# Patient Record
Sex: Female | Born: 1985 | Race: White | Hispanic: No | Marital: Single | Smoking: Never smoker
Health system: Southern US, Community
[De-identification: ages and names within clinical notes are randomized; demographics above are authoritative.]

## PROBLEM LIST (undated history)

## (undated) DIAGNOSIS — N200 Calculus of kidney: Secondary | ICD-10-CM

## (undated) HISTORY — PX: OOPHORECTOMY: SHX86

## (undated) HISTORY — DX: Calculus of kidney: N20.0

---

## 2004-01-19 ENCOUNTER — Inpatient Hospital Stay: Payer: Self-pay | Admitting: Obstetrics & Gynecology

## 2005-02-11 ENCOUNTER — Emergency Department: Payer: Self-pay | Admitting: Emergency Medicine

## 2005-08-15 ENCOUNTER — Emergency Department: Payer: Self-pay | Admitting: Emergency Medicine

## 2005-08-20 ENCOUNTER — Emergency Department: Payer: Self-pay | Admitting: Emergency Medicine

## 2005-11-01 ENCOUNTER — Emergency Department: Payer: Self-pay | Admitting: Emergency Medicine

## 2006-04-08 ENCOUNTER — Emergency Department: Payer: Self-pay

## 2006-05-24 ENCOUNTER — Emergency Department: Payer: Self-pay | Admitting: Emergency Medicine

## 2006-07-21 ENCOUNTER — Emergency Department: Payer: Self-pay | Admitting: Internal Medicine

## 2006-12-02 ENCOUNTER — Emergency Department: Payer: Self-pay | Admitting: Emergency Medicine

## 2006-12-31 ENCOUNTER — Emergency Department: Payer: Self-pay | Admitting: Emergency Medicine

## 2007-04-12 ENCOUNTER — Emergency Department: Payer: Self-pay | Admitting: Emergency Medicine

## 2007-05-15 ENCOUNTER — Emergency Department: Payer: Self-pay | Admitting: Emergency Medicine

## 2007-08-19 ENCOUNTER — Observation Stay: Payer: Self-pay

## 2007-10-10 ENCOUNTER — Inpatient Hospital Stay: Payer: Self-pay | Admitting: Obstetrics and Gynecology

## 2008-02-16 ENCOUNTER — Ambulatory Visit: Payer: Self-pay

## 2009-03-01 ENCOUNTER — Ambulatory Visit: Payer: Self-pay | Admitting: Internal Medicine

## 2009-03-02 ENCOUNTER — Emergency Department: Payer: Self-pay | Admitting: Emergency Medicine

## 2009-03-08 ENCOUNTER — Ambulatory Visit: Payer: Self-pay

## 2009-03-14 ENCOUNTER — Ambulatory Visit: Payer: Self-pay | Admitting: Gynecologic Oncology

## 2009-04-01 ENCOUNTER — Ambulatory Visit: Payer: Self-pay | Admitting: Internal Medicine

## 2011-01-21 ENCOUNTER — Emergency Department: Payer: Self-pay | Admitting: Emergency Medicine

## 2011-01-23 ENCOUNTER — Ambulatory Visit: Payer: Self-pay | Admitting: Internal Medicine

## 2011-01-28 ENCOUNTER — Ambulatory Visit: Payer: Self-pay | Admitting: Urology

## 2011-02-07 ENCOUNTER — Ambulatory Visit: Payer: Self-pay | Admitting: Urology

## 2011-10-08 ENCOUNTER — Ambulatory Visit (INDEPENDENT_AMBULATORY_CARE_PROVIDER_SITE_OTHER): Payer: PRIVATE HEALTH INSURANCE | Admitting: Family Medicine

## 2011-10-08 ENCOUNTER — Encounter: Payer: Self-pay | Admitting: Family Medicine

## 2011-10-08 VITALS — BP 100/80 | HR 72 | Temp 98.1°F | Ht 63.5 in | Wt 144.0 lb

## 2011-10-08 DIAGNOSIS — R300 Dysuria: Secondary | ICD-10-CM

## 2011-10-08 DIAGNOSIS — R5383 Other fatigue: Secondary | ICD-10-CM

## 2011-10-08 DIAGNOSIS — Z87442 Personal history of urinary calculi: Secondary | ICD-10-CM | POA: Insufficient documentation

## 2011-10-08 DIAGNOSIS — R5381 Other malaise: Secondary | ICD-10-CM

## 2011-10-08 LAB — POCT URINALYSIS DIPSTICK
Ketones, UA: NEGATIVE
pH, UA: 6.5

## 2011-10-08 LAB — CBC WITH DIFFERENTIAL/PLATELET
Eosinophils Relative: 1.8 % (ref 0.0–5.0)
HCT: 39.1 % (ref 36.0–46.0)
Hemoglobin: 13.5 g/dL (ref 12.0–15.0)
Lymphs Abs: 1.3 10*3/uL (ref 0.7–4.0)
Monocytes Relative: 9.4 % (ref 3.0–12.0)
Neutro Abs: 1.5 10*3/uL (ref 1.4–7.7)
WBC: 3.2 10*3/uL — ABNORMAL LOW (ref 4.5–10.5)

## 2011-10-08 LAB — COMPREHENSIVE METABOLIC PANEL
AST: 17 U/L (ref 0–37)
Albumin: 4.7 g/dL (ref 3.5–5.2)
BUN: 16 mg/dL (ref 6–23)
Calcium: 9.6 mg/dL (ref 8.4–10.5)
Chloride: 106 mEq/L (ref 96–112)
Glucose, Bld: 102 mg/dL — ABNORMAL HIGH (ref 70–99)
Potassium: 4.3 mEq/L (ref 3.5–5.1)
Sodium: 139 mEq/L (ref 135–145)
Total Protein: 7.4 g/dL (ref 6.0–8.3)

## 2011-10-08 LAB — TSH: TSH: 1.15 u[IU]/mL (ref 0.35–5.50)

## 2011-10-08 NOTE — Progress Notes (Signed)
  Subjective:    Patient ID: Lindsay Collins, female    DOB: Feb 28, 1986, 26 y.o.   MRN: [id number]  HPI  Very pleasant G2P2 here to establish care.  [facility]es to [facility] department for Depo- periods are light.  Sleeping well, but feels like she could sleep all day and not feel well rested. Denies any symptoms of anxiety or depression.  Denies heat or cold intolerance. No palpitations or changes in her bowels.  Otherwise doing well.  Passed her first kidney stone a few months ago. No further abdominal pain, dysuria or hematuria.  Patient Active Problem List  Diagnosis  . Fatigue   Past Medical History  Diagnosis Date  . Nephrolithiasis    No past surgical history on file. History  Substance Use Topics  . Smoking status: Never Smoker   . Smokeless tobacco: Not on file  . Alcohol Use: Not on file   Family History  Problem Relation Age of Onset  . COPD Mother   . COPD Father   . Cancer Father     prostate   No Known Allergies Current Outpatient Prescriptions on File Prior to Visit  Medication Sig Dispense Refill  . medroxyPROGESTERone (DEPO-PROVERA) 150 MG/ML injection Inject 150 mg into the muscle every 3 (three) months.       The PMH, PSH, Social History, Family History, Medications, and allergies have been reviewed in [facility], and have been updated if relevant.   Review of Systems See HPI    Objective:   Physical Exam BP 100/80  Pulse 72  Temp 98.1 F (36.7 C)  Ht 5' 3.5" (1.613 m)  Wt 144 lb (65.318 kg)  BMI 25.11 kg/m2  General:  Well-developed,well-nourished,in no acute distress; alert,appropriate and cooperative throughout examination Head:  normocephalic and atraumatic.   Eyes:  vision grossly intact, pupils equal, pupils round, and pupils reactive to light.   Ears:  R ear normal and L ear normal.   Nose:  no external deformity.   Mouth:  good dentition.   Neck:  No deformities, masses, or tenderness noted. Lungs:  Normal respiratory effort, chest expands  symmetrically. Lungs are clear to auscultation, no crackles or wheezes. Heart:  Normal rate and regular rhythm. S1 and S2 normal without gallop, murmur, click, rub or other extra sounds. Abdomen:  Bowel sounds positive,abdomen soft and non-tender without masses, organomegaly or hernias noted. Msk:  No deformity or scoliosis noted of thoracic or lumbar spine.   Extremities:  No clubbing, cyanosis, edema, or deformity noted with normal full range of motion of all joints.   Neurologic:  alert & oriented X3 and gait normal.   Skin:  Intact without suspicious lesions or rashes Psych:  Cognition and judgment appear intact. Alert and cooperative with normal attention span and concentration. No apparent delusions, illusions, hallucinations     Assessment & Plan:   1. Fatigue  New- exam unremarkable. Likely multifactorial- will check labs today. The patient indicates understanding of these issues and agrees with the plan.   CBC with Differential, TSH, T4, Free, Comprehensive metabolic panel, Vitamin D, 25-hydroxy  2. History of nephrolithiasis   UA pos for LE and trace blood today. Since she is asymptomatic, will send urine for cx with tx today. The patient indicates understanding of these issues and agrees with the plan.

## 2011-10-08 NOTE — Patient Instructions (Addendum)
It was great to see you. We will call you with your lab results likely tomorrow. Have a great day with your kids.

## 2011-10-09 ENCOUNTER — Other Ambulatory Visit: Payer: Self-pay | Admitting: Family Medicine

## 2011-10-09 DIAGNOSIS — D72819 Decreased white blood cell count, unspecified: Secondary | ICD-10-CM

## 2011-10-09 NOTE — Addendum Note (Signed)
Addended by: Alvina Chou on: 10/09/2011 08:29 AM   Modules accepted: Orders

## 2011-10-10 ENCOUNTER — Other Ambulatory Visit (INDEPENDENT_AMBULATORY_CARE_PROVIDER_SITE_OTHER): Payer: PRIVATE HEALTH INSURANCE

## 2011-10-10 DIAGNOSIS — D72819 Decreased white blood cell count, unspecified: Secondary | ICD-10-CM

## 2011-10-11 LAB — CBC WITH DIFFERENTIAL/PLATELET
Basophils Absolute: 0 10*3/uL (ref 0.0–0.1)
Eosinophils Absolute: 0.1 10*3/uL (ref 0.0–0.7)
HCT: 37.4 % (ref 36.0–46.0)
Hemoglobin: 12.8 g/dL (ref 12.0–15.0)
Lymphs Abs: 1.7 10*3/uL (ref 0.7–4.0)
MCHC: 34.3 g/dL (ref 30.0–36.0)
Neutro Abs: 1.7 10*3/uL (ref 1.4–7.7)
Platelets: 192 10*3/uL (ref 150.0–400.0)
RDW: 13.4 % (ref 11.5–14.6)

## 2011-10-28 ENCOUNTER — Telehealth: Payer: Self-pay | Admitting: Family Medicine

## 2011-10-28 NOTE — Telephone Encounter (Signed)
Spoke with patient.  She says that she will wait and see how she feels tomorrow and if not any better she will call for appt or will drop off a urine sample.

## 2011-10-28 NOTE — Telephone Encounter (Signed)
Yes ok for pt to leave sample today for UA.

## 2011-10-28 NOTE — Telephone Encounter (Signed)
Caller: Lindsay Collins/Patient; PCP: Ruthe Mannan ([facility]); CB#: [phone];  Call regarding Urinary Pain/Bleeding.  SX started last week and pt spoke with Dr. when she was rounding in a [facility].  Was inst pt could get a U/A done.  Pt continues to have SX today.  Calling about an appt for today.  No appts available.  Pt having some frequency and urgency.  Sx are better than last week.   Triaged U/A SX and last voided at 1215.  Pt has light vaginal discharge.   All emergent SX RO.  Disp = needs to be seen in 24 hrs and pt needs an appt after 1500. No appts after 1500 10/29/11 either.   Pt disconnected and recalled and message left.   PLEASE CALL PT TO ADVISE OR INST IF SHE CAN COME IN FOR LAB U/A.

## 2011-10-31 ENCOUNTER — Encounter: Payer: Self-pay | Admitting: Family Medicine

## 2011-10-31 ENCOUNTER — Telehealth: Payer: Self-pay

## 2011-10-31 ENCOUNTER — Ambulatory Visit (INDEPENDENT_AMBULATORY_CARE_PROVIDER_SITE_OTHER): Payer: PRIVATE HEALTH INSURANCE | Admitting: Family Medicine

## 2011-10-31 VITALS — BP 102/70 | HR 83 | Temp 98.5°F | Wt 147.0 lb

## 2011-10-31 DIAGNOSIS — R109 Unspecified abdominal pain: Secondary | ICD-10-CM

## 2011-10-31 NOTE — Assessment & Plan Note (Signed)
Using contraception w/o red flags items by history or exam. Likely oblique strain.  I don't suspect intraabdominal pathology.  Would use ibuprofen with GI caution and notify PCP if continued.  D/w pt's PCP today.

## 2011-10-31 NOTE — Telephone Encounter (Signed)
Pt has lower left abdominal pain on and off for 2 weeks; now sharp stabbing pain occurring more often; can be worse when walking. No fever, N or V, no UTI symptoms and no constipation or diarrhea. Pt said does not feel like menstrual symptoms. Dr Dayton Martes can see pt if she comes now. Pt could not leave work and scheduled with Dr Para March at 12:15pm. Pt to call back if symptoms change or worsen.

## 2011-10-31 NOTE — Telephone Encounter (Signed)
Will see pt today.  

## 2011-10-31 NOTE — Patient Instructions (Addendum)
Take 2-3 over the counter ibuprofen tabs up to 2-3 times a day with food.  This should gradually get better.  Take care.

## 2011-10-31 NOTE — Progress Notes (Signed)
On depo provera since 2005 and due for next shot in mid August.  A few weeks of lower abd pain. Would prev come and go, now more frequent but not constant.  Can be sharp or dull.  Worse with movement, ie a lot of walking.  L lower abd pain. No R sided pain.  No trauma, no trigger known.  No FCNAVD.  No discharge, dysuria.  She'll have light menses before depo shot, that is baseline for her.  She did have some spotting a few weeks ago, for one day.  This can happen irregularly while on depo and this was typical for her, can happen a few times a year.  No blood in stool.   CNA at [employer].  No flank pain as with prev renal stones.    Meds, vitals, and allergies reviewed.   ROS: See HPI.  Otherwise, noncontributory.  GEN: nad, alert and oriented NECK: supple w/o LA CV: rrr PULM: ctab, no inc wob ABD: soft, +bs, not ttp (the area of prev discomfort is left of the umbilicus) x4 quadrants.  No pain on rectus testing but mild discomfort on L oblique testing, no cva pain EXT: no edema SKIN: no acute rash

## 2011-11-01 ENCOUNTER — Telehealth: Payer: Self-pay | Admitting: Family Medicine

## 2011-11-01 DIAGNOSIS — R1032 Left lower quadrant pain: Secondary | ICD-10-CM

## 2011-11-01 NOTE — Telephone Encounter (Signed)
Talked to pt.  Abdominal pain localized now to LLQ, worse with walking but not with movement. Will order pelvic and transvaginal ultrasound.

## 2011-11-04 ENCOUNTER — Ambulatory Visit: Payer: Self-pay | Admitting: Family Medicine

## 2011-11-05 ENCOUNTER — Encounter: Payer: Self-pay | Admitting: Family Medicine

## 2011-11-05 ENCOUNTER — Other Ambulatory Visit: Payer: Self-pay | Admitting: Family Medicine

## 2011-11-05 ENCOUNTER — Telehealth: Payer: Self-pay

## 2011-11-05 DIAGNOSIS — D729 Disorder of white blood cells, unspecified: Secondary | ICD-10-CM

## 2011-11-05 NOTE — Telephone Encounter (Signed)
Pt request call back ASAP with ultrasound report done at [facility]. Pt said pain is the same in left lower side of abdomen.

## 2011-11-06 NOTE — Telephone Encounter (Signed)
Pt has been advised, see result note.

## 2011-11-11 ENCOUNTER — Other Ambulatory Visit (INDEPENDENT_AMBULATORY_CARE_PROVIDER_SITE_OTHER): Payer: PRIVATE HEALTH INSURANCE

## 2011-11-11 DIAGNOSIS — D729 Disorder of white blood cells, unspecified: Secondary | ICD-10-CM

## 2011-11-11 LAB — CBC WITH DIFFERENTIAL/PLATELET
Basophils Absolute: 0 10*3/uL (ref 0.0–0.1)
Eosinophils Absolute: 0.1 10*3/uL (ref 0.0–0.7)
HCT: 35 % — ABNORMAL LOW (ref 36.0–46.0)
Lymphs Abs: 2.2 10*3/uL (ref 0.7–4.0)
MCHC: 34.2 g/dL (ref 30.0–36.0)
MCV: 93.6 fl (ref 78.0–100.0)
Monocytes Absolute: 0.4 10*3/uL (ref 0.1–1.0)
Platelets: 188 10*3/uL (ref 150.0–400.0)
RDW: 12.8 % (ref 11.5–14.6)

## 2011-11-12 ENCOUNTER — Telehealth: Payer: Self-pay | Admitting: *Deleted

## 2011-11-12 NOTE — Telephone Encounter (Signed)
Advised patient of lab results, lab appt. Made.

## 2011-12-09 ENCOUNTER — Telehealth: Payer: Self-pay | Admitting: Family Medicine

## 2011-12-09 ENCOUNTER — Other Ambulatory Visit (INDEPENDENT_AMBULATORY_CARE_PROVIDER_SITE_OTHER): Payer: PRIVATE HEALTH INSURANCE

## 2011-12-09 ENCOUNTER — Ambulatory Visit (INDEPENDENT_AMBULATORY_CARE_PROVIDER_SITE_OTHER): Payer: PRIVATE HEALTH INSURANCE | Admitting: *Deleted

## 2011-12-09 DIAGNOSIS — Z23 Encounter for immunization: Secondary | ICD-10-CM

## 2011-12-09 DIAGNOSIS — D72819 Decreased white blood cell count, unspecified: Secondary | ICD-10-CM

## 2011-12-09 LAB — CBC WITH DIFFERENTIAL/PLATELET
Eosinophils Relative: 1.6 % (ref 0.0–5.0)
MCV: 93.7 fl (ref 78.0–100.0)
Monocytes Absolute: 0.4 10*3/uL (ref 0.1–1.0)
Monocytes Relative: 10.3 % (ref 3.0–12.0)
Neutrophils Relative %: 42.8 % — ABNORMAL LOW (ref 43.0–77.0)
Platelets: 184 10*3/uL (ref 150.0–400.0)
WBC: 4.2 10*3/uL — ABNORMAL LOW (ref 4.5–10.5)
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
## 2011-12-09 NOTE — Telephone Encounter (Signed)
Advised patient ok to come in today at 3:00.

## 2011-12-10 ENCOUNTER — Other Ambulatory Visit: Payer: Self-pay | Admitting: Family Medicine

## 2011-12-10 DIAGNOSIS — D72819 Decreased white blood cell count, unspecified: Secondary | ICD-10-CM

## 2011-12-12 ENCOUNTER — Other Ambulatory Visit: Payer: PRIVATE HEALTH INSURANCE

## 2011-12-16 ENCOUNTER — Ambulatory Visit: Payer: PRIVATE HEALTH INSURANCE | Admitting: Family Medicine

## 2012-01-13 ENCOUNTER — Telehealth: Payer: Self-pay | Admitting: Family Medicine

## 2012-01-13 NOTE — Telephone Encounter (Signed)
Caller: [name]/Patient; Patient Name: Lindsay Collins; PCP: Ruthe Mannan [facility]); Best Callback Phone Number: [phone]. Onset 01/12/12 Patient states she strain  her back, right lower back.  All emergent symptoms ruled out per Back symptoms protocol with exception " Pain in the back associated with walking or increased activity and apin relieved by stopping the activity."  Home care advice given.

## 2012-02-10 ENCOUNTER — Other Ambulatory Visit (INDEPENDENT_AMBULATORY_CARE_PROVIDER_SITE_OTHER): Payer: Commercial Managed Care - PPO

## 2012-02-10 DIAGNOSIS — D729 Disorder of white blood cells, unspecified: Secondary | ICD-10-CM

## 2012-02-10 LAB — CBC WITH DIFFERENTIAL/PLATELET
Basophils Absolute: 0 10*3/uL (ref 0.0–0.1)
Basophils Relative: 0.6 % (ref 0.0–3.0)
Eosinophils Relative: 1.9 % (ref 0.0–5.0)
HCT: 37.1 % (ref 36.0–46.0)
Hemoglobin: 12.6 g/dL (ref 12.0–15.0)
Lymphs Abs: 2.1 10*3/uL (ref 0.7–4.0)
Monocytes Relative: 9.9 % (ref 3.0–12.0)
Neutro Abs: 1.7 10*3/uL (ref 1.4–7.7)
RBC: 3.98 Mil/uL (ref 3.87–5.11)
RDW: 13.1 % (ref 11.5–14.6)

## 2012-02-11 ENCOUNTER — Other Ambulatory Visit: Payer: Self-pay | Admitting: Family Medicine

## 2012-02-11 DIAGNOSIS — D72819 Decreased white blood cell count, unspecified: Secondary | ICD-10-CM

## 2012-02-13 ENCOUNTER — Ambulatory Visit: Payer: Self-pay | Admitting: Hematology and Oncology

## 2012-02-25 ENCOUNTER — Ambulatory Visit: Payer: Self-pay | Admitting: Hematology and Oncology

## 2012-02-25 LAB — CBC CANCER CENTER
Basophil %: 0.6 %
Eosinophil %: 2 %
HCT: 36.6 % (ref 35.0–47.0)
Lymphocyte #: 1.9 x10 3/mm (ref 1.0–3.6)
Lymphocyte %: 41.4 %
MCHC: 36.2 g/dL — ABNORMAL HIGH (ref 32.0–36.0)
MCV: 92 fL (ref 80–100)
Monocyte %: 8.6 %
Neutrophil %: 47.4 %
Platelet: 206 x10 3/mm (ref 150–440)
RBC: 4 10*6/uL (ref 3.80–5.20)
WBC: 4.6 x10 3/mm (ref 3.6–11.0)

## 2012-03-01 ENCOUNTER — Ambulatory Visit: Payer: Self-pay | Admitting: Hematology and Oncology

## 2012-03-04 ENCOUNTER — Telehealth: Payer: Self-pay | Admitting: *Deleted

## 2012-03-04 MED ORDER — CYANOCOBALAMIN 1000 MCG/ML IJ SOLN: 1000.0000 ug | Freq: Once | INTRAMUSCULAR | Status: DC

## 2012-03-04 NOTE — Telephone Encounter (Signed)
She wants the shots, but doesn't want to go to hematologist to get them.  She's asking if you will prescribe it for her and she will have the nurses at [facility] administer.

## 2012-03-04 NOTE — Telephone Encounter (Signed)
Advised patient that, after reviewing note from hematology, that you do agree that patient may benefit from B12 injections.  She's asking if this can be sent to [facility] and she will have the nurses at [facility] give her the shots.  Please advise.

## 2012-03-04 NOTE — Telephone Encounter (Signed)
Oral supplementation is ok too.  I thought she was asking if ok to receive injections at hematologist.  She can check with them if she would like, but oral b12 works as well.  Which would she prefer?

## 2012-03-04 NOTE — Telephone Encounter (Signed)
Advised patient.  Lab appt scheduled.

## 2012-03-04 NOTE — Telephone Encounter (Signed)
Rx sent.  Let's give her one dose and recheck B12 level here in one month prior to receiving more injections since her levels were still within normal (low normal).

## 2012-03-13 ENCOUNTER — Ambulatory Visit (INDEPENDENT_AMBULATORY_CARE_PROVIDER_SITE_OTHER): Payer: Commercial Managed Care - PPO | Admitting: Family Medicine

## 2012-03-13 ENCOUNTER — Encounter: Payer: Self-pay | Admitting: Family Medicine

## 2012-03-13 VITALS — BP 120/60 | HR 87 | Temp 98.0°F | Wt 147.0 lb

## 2012-03-13 DIAGNOSIS — F4320 Adjustment disorder, unspecified: Secondary | ICD-10-CM | POA: Insufficient documentation

## 2012-03-13 MED ORDER — ESCITALOPRAM OXALATE 10 MG PO TABS: 10.0000 mg | ORAL_TABLET | Freq: Every day | ORAL | Status: DC

## 2012-03-13 MED ORDER — LORAZEPAM 0.5 MG PO TABS: 0.5000 mg | ORAL_TABLET | Freq: Three times a day (TID) | ORAL | Status: DC | PRN

## 2012-03-13 NOTE — Progress Notes (Signed)
  Subjective:    Patient ID: Lindsay Collins, female    DOB: 12/09/1985, 26 y.o.   MRN: [id number]  HPI  Very pleasant 26 yo female here to discuss anxiety.  Last several months, feels "on edge" all the time. Has two sons, 8 and 29 yo that she is raising alone.  Does not have any family support.  The oldest son is constantly getting in trouble- he is seeing a therapist. She is concerned that she snaps at him too much.  Does not hit her children and has no desire to hurt herself or her children. She has been more tearful and not sleeping well.  Feels constantly anxious.  Appetite good. Weight stable. Wt Readings from Last 3 Encounters:  03/13/12 147 lb (66.679 kg)  10/31/11 147 lb (66.679 kg)  10/08/11 144 lb (65.318 kg)   She has no personal or family history of anxiety or depression. Denies any symptoms of mania.  Patient Active Problem List  Diagnosis  . Fatigue  . History of nephrolithiasis  . Leukopenia  . Abdominal  pain, other specified site  . Adjustment disorder   Past Medical History  Diagnosis Date  . Nephrolithiasis    No past surgical history on file. History  Substance Use Topics  . Smoking status: Never Smoker   . Smokeless tobacco: Not on file  . Alcohol Use: Not on file   Family History  Problem Relation Age of Onset  . COPD Mother   . COPD Father   . Cancer Father     prostate   No Known Allergies Current Outpatient Prescriptions on File Prior to Visit  Medication Sig Dispense Refill  . medroxyPROGESTERone (DEPO-PROVERA) 150 MG/ML injection Inject 150 mg into the muscle every 3 (three) months.      . escitalopram (LEXAPRO) 10 MG tablet Take 1 tablet (10 mg total) by mouth daily.  30 tablet  1   The PMH, PSH, Social History, Family History, Medications, and allergies have been reviewed in [facility], and have been updated if relevant.   Review of Systems See HPI    Objective:   Physical Exam BP 120/60  Pulse 87  Temp 98 F (36.7 C) (Oral)  Wt 147  lb (66.679 kg)  SpO2 97% Gen:  Alert, pleasant, NAD Psych:  Good eye contact, tearful and anxious appearing     Assessment & Plan:   1. Adjustment disorder    >25 min spent with face to face with patient, >50% counseling and/or coordinating care. She would like to defer psychotherapy at this time.  Discussed treatment options- start Lexapro 10 mg daily, as needed Ativan. She is aware of sedation and addiction potential associated with benzos.  Also aware that it is not safe to take these medications during pregnancy. Follow up in 2-3 weeks.

## 2012-03-13 NOTE — Patient Instructions (Addendum)
Good to see you.  Please call me or come see me at [facility] in 2-3 weeks.  Escitalopram tablets What is this medicine? ESCITALOPRAM (es sye TAL oh pram) is used to treat depression and certain types of anxiety. This medicine may be used for other purposes; ask your health care provider or pharmacist if you have questions. What should I tell my health care provider before I take this medicine? They need to know if you have any of these conditions: -bipolar disorder or a family history of bipolar disorder -diabetes -heart disease -kidney or liver disease -receiving electroconvulsive therapy -seizures (convulsions) -suicidal thoughts, plans, or attempt by you or a family member -an unusual or allergic reaction to escitalopram, the related drug citalopram, other medicines, foods, dyes, or preservatives -pregnant or trying to become pregnant -breast-feeding How should I use this medicine? Take this medicine by mouth with a glass of water. Follow the directions on the prescription label. You can take it with or without food. If it upsets your stomach, take it with food. Take your medicine at regular intervals. Do not take it more often than directed. Do not stop taking this medicine suddenly except upon the advice of your doctor. Stopping this medicine too quickly may cause serious side effects or your condition may worsen. A special MedGuide will be given to you by the pharmacist with each prescription and refill. Be sure to read this information carefully each time. Talk to your pediatrician regarding the use of this medicine in children. Special care may be needed. Overdosage: If you think you have taken too much of this medicine contact a poison control center or emergency room at once. NOTE: This medicine is only for you. Do not share this medicine with others. What if I miss a dose? If you miss a dose, take it as soon as you can. If it is almost time for your next dose, take only that  dose. Do not take double or extra doses. What may interact with this medicine? Do not take this medicine with any of the following medications: -cisapride -citalopram -linezolid -MAOIs like Carbex, Eldepryl, Marplan, Nardil, and Parnate -methylene blue (injected into a vein) -pimozide This medicine may also interact with the following medications: -alcohol -aspirin and aspirin-like medicines -carbamazepine -certain medicines for depression, anxiety, or psychotic disturbances -certain medicines for migraine headache like almotriptan, eletriptan, frovatriptan, naratriptan, rizatriptan, sumatriptan, zolmitriptan -cimetidine -diuretics -fentanyl -furazolidone -isoniazid -ketoconazole -lithium -medicines that treat or prevent blood clots like warfarin, enoxaparin, and dalteparin -medicines for sleep -metoprolol -NSAIDs, medicines for pain and inflammation, like ibuprofen or naproxen -procarbazine -rasagiline -supplements like St. John's wort, kava kava, valerian -tramadol -tryptophan This list may not describe all possible interactions. Give your health care provider a list of all the medicines, herbs, non-prescription drugs, or dietary supplements you use. Also tell them if you smoke, drink alcohol, or use illegal drugs. Some items may interact with your medicine. What should I watch for while using this medicine? Tell your doctor if your symptoms do not get better or if they get worse. Visit your doctor or health care professional for regular checks on your progress. Because it may take several weeks to see the full effects of this medicine, it is important to continue your treatment as prescribed by your doctor. Patients and their families should watch out for new or worsening thoughts of suicide or depression. Also watch out for sudden changes in feelings such as feeling anxious, agitated, panicky, irritable, hostile, aggressive, impulsive, severely  restless, overly excited and  hyperactive, or not being able to sleep. If this happens, especially at the beginning of treatment or after a change in dose, call your health care professional. Bonita Quin may get drowsy or dizzy. Do not drive, use machinery, or do anything that needs mental alertness until you know how this medicine affects you. Do not stand or sit up quickly, especially if you are an older patient. This reduces the risk of dizzy or fainting spells. Alcohol may interfere with the effect of this medicine. Avoid alcoholic drinks. Your mouth may get dry. Chewing sugarless gum or sucking hard candy, and drinking plenty of water may help. Contact your doctor if the problem does not go away or is severe. What side effects may I notice from receiving this medicine? Side effects that you should report to your doctor or health care professional as soon as possible: -allergic reactions like skin rash, itching or hives, swelling of the face, lips, or tongue -confusion -feeling faint or lightheaded, falls -fast talking and excited feelings or actions that are out of control -hallucination, loss of contact with reality -seizures -suicidal thoughts or other mood changes -unusual bleeding or bruising Side effects that usually do not require medical attention (report to your doctor or health care professional if they continue or are bothersome): -blurred vision -changes in appetite -change in sex drive or performance -headache -increased sweating -nausea This list may not describe all possible side effects. Call your doctor for medical advice about side effects. You may report side effects to FDA at 1-800-FDA-1088. Where should I keep my medicine? Keep out of reach of children. Store at room temperature between 15 and 30 degrees C (59 and 86 degrees F). Throw away any unused medicine after the expiration date. NOTE: This sheet is a summary. It may not cover all possible information. If you have questions about this medicine, talk  to your doctor, pharmacist, or health care provider.  2013, [employer]. (08/02/2011 7:12:08 PM)

## 2012-04-06 ENCOUNTER — Other Ambulatory Visit (INDEPENDENT_AMBULATORY_CARE_PROVIDER_SITE_OTHER): Payer: Commercial Managed Care - PPO

## 2012-04-06 DIAGNOSIS — R5381 Other malaise: Secondary | ICD-10-CM

## 2012-04-06 DIAGNOSIS — R5383 Other fatigue: Secondary | ICD-10-CM

## 2012-04-08 ENCOUNTER — Telehealth: Payer: Self-pay | Admitting: *Deleted

## 2012-04-08 MED ORDER — CYANOCOBALAMIN 1000 MCG/ML IJ SOLN: 1000.0000 ug | Freq: Once | INTRAMUSCULAR | Status: DC

## 2012-04-08 NOTE — Telephone Encounter (Signed)
Yes absolutely.  I will refill her B12.

## 2012-04-08 NOTE — Telephone Encounter (Signed)
Pt was advised of lab results yesterday.  She says she doesn't feel any better after the one B12 injection that she got and is asking if she should continue with the injections.  Please advise.

## 2012-04-08 NOTE — Telephone Encounter (Signed)
Advised patient.  She is asking if she needs to continue after this injection.

## 2012-04-09 ENCOUNTER — Other Ambulatory Visit: Payer: Self-pay | Admitting: *Deleted

## 2012-04-09 MED ORDER — ESCITALOPRAM OXALATE 20 MG PO TABS: 20.0000 mg | ORAL_TABLET | Freq: Every day | ORAL | Status: DC

## 2012-04-09 MED ORDER — CYANOCOBALAMIN 1000 MCG/ML IJ SOLN: 1000.0000 ug | Freq: Once | INTRAMUSCULAR | Status: DC

## 2012-04-09 NOTE — Telephone Encounter (Signed)
Please see note below.  Patient states hematologist suggested that she have B12 injections once a week for 4 weeks and then once a month.  You had not agreed with that at the time, but pt asks that since her level has dropped would that schedule be appropriate now.  Please advise.

## 2012-04-09 NOTE — Telephone Encounter (Signed)
Left message asking patient to call back

## 2012-04-09 NOTE — Telephone Encounter (Signed)
Advised patient.  Refills sent to pharmacy, lab appt scheduled.

## 2012-04-09 NOTE — Telephone Encounter (Signed)
Yes, 20 mg tablets sent to pharmacy.  Please let us know how she is feeling on higher dose in a few weeks.

## 2012-04-09 NOTE — Telephone Encounter (Signed)
Yes that would be ok to try once a week for 4 weeks, then once monthly.  Recheck B12 in 2 months please.

## 2012-04-09 NOTE — Telephone Encounter (Signed)
Pt is asking for a refill on lexapro but asks if the dose can be increased.  She states she hasnt noticed any improvement with the 10 mg dose that she has been on for almost a month.  Please advise.

## 2012-04-10 ENCOUNTER — Other Ambulatory Visit: Payer: Self-pay | Admitting: Family Medicine

## 2012-04-10 MED ORDER — CYANOCOBALAMIN 1000 MCG/ML IJ SOLN: INTRAMUSCULAR | Status: DC

## 2012-04-10 NOTE — Telephone Encounter (Signed)
Advised patient as instructed. 

## 2012-05-11 ENCOUNTER — Telehealth: Payer: Self-pay | Admitting: Family Medicine

## 2012-05-11 MED ORDER — ONDANSETRON HCL 4 MG PO TABS: 4.0000 mg | ORAL_TABLET | Freq: Three times a day (TID) | ORAL | Status: DC | PRN

## 2012-05-11 NOTE — Telephone Encounter (Signed)
Rx sent 

## 2012-05-11 NOTE — Telephone Encounter (Signed)
Left message asking patient to call back

## 2012-05-11 NOTE — Telephone Encounter (Signed)
Spoke with patient.  She says she had one episode of vomiting on Friday but has not vomited since.  Has, although,continued to have nausea daily.  She took all of her zofran over the week end and is asking if she can have a refill sent to [facility] in [place].

## 2012-05-11 NOTE — Telephone Encounter (Signed)
[facility] Triage Call Report Triage Record Num: [id number] Operator: Maryfrances Bunnell Patient Name: Lindsay Collins Call Date & Time: 05/09/2012 9:17:07AM Patient Phone: [phone] PCP: Ruthe Mannan Patient Gender: Female PCP Fax : [phone] Patient DOB: 06/03/1985 [facility] Name: [facility] Reason for Call: Caller: [name]/Patient; PCP: Ruthe Mannan (Family Practice); CB#: [phone]; Call regarding Nausea; Nausea onset 05/08/12; Vomiting x 1 and slight cramping just before; Diarrhea x 1 2/7; Afebrile; Nausea continuing this am, no vomiting. Per Nausea or Vomiting Protocol all emergent symptoms ruled out, home care advice given. Per standing orders/Dr. Milinda Antis on call MD, Zofran 4 mg, take 4-8 mg po q 8 hrs prn # [phone]. Protocol(s) Used: Nausea or Vomiting Recommended Outcome per Protocol: Provide Home/Self Care Reason for Outcome: All other situations Care Advice: ~ SYMPTOM / CONDITION MANAGEMENT Nausea Care Advice: - Drink small amounts of clear, sweetened liquids or ice cold drinks. - Eat light, bland foods such as saltine crackers or plain bread. - Do not eat high fat, highly seasoned, high fiber, or high sugar content foods. - Avoid mixing hot food and cold foods. - Eat smaller, more frequent meals. - Rest as much as possible in a sitting or in a propped lying position. Do not lie flat for at least 2 hours after eating. - Do not take pain medication (such as aspirin, NSAIDs) while nauseated. - Rest as much as possible until symptoms improve since activity may worsen nausea. ~ Call provider within 8 hours if unable to keep any fluids down for more than 8 hours, fever over 101.5 F (38.6 C), or severe vomiting with diarrhea. ~ 05/09/2012 9:28:30AM Page 1 of 1 [email or web address]_[email or web address]_[email or web address]

## 2012-05-11 NOTE — Telephone Encounter (Signed)
Please call to check on pt. 

## 2012-05-15 ENCOUNTER — Ambulatory Visit: Payer: PRIVATE HEALTH INSURANCE | Admitting: Family Medicine

## 2012-05-20 ENCOUNTER — Ambulatory Visit: Payer: PRIVATE HEALTH INSURANCE | Admitting: Family Medicine

## 2012-05-21 ENCOUNTER — Other Ambulatory Visit (HOSPITAL_COMMUNITY)
Admission: RE | Admit: 2012-05-21 | Discharge: 2012-05-21 | Disposition: A | Discharge status: Home / Self Care | Payer: Commercial Managed Care - PPO | Source: Ambulatory Visit | Attending: Family Medicine | Admitting: Family Medicine

## 2012-05-21 ENCOUNTER — Telehealth: Payer: Self-pay | Admitting: Family Medicine

## 2012-05-21 ENCOUNTER — Encounter: Payer: Self-pay | Admitting: Family Medicine

## 2012-05-21 ENCOUNTER — Ambulatory Visit (INDEPENDENT_AMBULATORY_CARE_PROVIDER_SITE_OTHER): Payer: Commercial Managed Care - PPO | Admitting: Family Medicine

## 2012-05-21 VITALS — BP 100/68 | HR 88 | Temp 98.4°F | Resp 16 | Wt 146.0 lb

## 2012-05-21 DIAGNOSIS — R5381 Other malaise: Secondary | ICD-10-CM

## 2012-05-21 DIAGNOSIS — Z01419 Encounter for gynecological examination (general) (routine) without abnormal findings: Secondary | ICD-10-CM | POA: Insufficient documentation

## 2012-05-21 DIAGNOSIS — R5383 Other fatigue: Secondary | ICD-10-CM

## 2012-05-21 DIAGNOSIS — R300 Dysuria: Secondary | ICD-10-CM

## 2012-05-21 DIAGNOSIS — Z113 Encounter for screening for infections with a predominantly sexual mode of transmission: Secondary | ICD-10-CM | POA: Insufficient documentation

## 2012-05-21 DIAGNOSIS — N926 Irregular menstruation, unspecified: Secondary | ICD-10-CM

## 2012-05-21 DIAGNOSIS — F4320 Adjustment disorder, unspecified: Secondary | ICD-10-CM

## 2012-05-21 LAB — CBC WITH DIFFERENTIAL/PLATELET
Basophils Relative: 0.6 % (ref 0.0–3.0)
Eosinophils Relative: 1.3 % (ref 0.0–5.0)
Lymphocytes Relative: 42.6 % (ref 12.0–46.0)
MCV: 91.6 fl (ref 78.0–100.0)
Monocytes Absolute: 0.4 10*3/uL (ref 0.1–1.0)
Neutrophils Relative %: 46.1 % (ref 43.0–77.0)
Platelets: 240 10*3/uL (ref 150.0–400.0)
RBC: 4.11 Mil/uL (ref 3.87–5.11)
WBC: 4.6 10*3/uL (ref 4.5–10.5)

## 2012-05-21 LAB — POCT URINALYSIS DIPSTICK
Bilirubin, UA: NEGATIVE
Ketones, UA: NEGATIVE
Leukocytes, UA: NEGATIVE
Nitrite, UA: NEGATIVE
pH, UA: 6

## 2012-05-21 LAB — POCT URINE PREGNANCY: Preg Test, Ur: NEGATIVE

## 2012-05-21 LAB — TSH: TSH: 0.99 u[IU]/mL (ref 0.35–5.50)

## 2012-05-21 NOTE — Telephone Encounter (Signed)
Pt r/s for 05/21/2012 at 12:30 p.m.

## 2012-05-21 NOTE — Progress Notes (Signed)
Subjective:    Patient ID: Lindsay Collins, female    DOB: 03-Apr-1985, 27 y.o.   MRN: [id number]  HPI  Very pleasant 27 yo G2P2 here for Routine GYN exam and to discuss some other issues.  Has been on Depo provera through the health department since 2005.  Last dose was in 03/2012.  She has not missed a dose.  She has never had a regular period since starting it and has not had spotting in years. 4 days ago spotted once, then yesterday had heavy bleeding with clotting for half a day.  She also had some suprapubic, midline pain.  No dysuria.  No back pain.  Has not been sexually active in months but she has noticed a yellow discharge.  Normal pelvic/transvaginal ultrasound in 10/2011.    Not currently having any pain.  Lexapro working well for anxiety.  Feels better.  Fatigue- remains fatigued.  CBC has been stable and hematology felt low WBC was reactive.  B12 normal.  Patient Active Problem List  Diagnosis  . Fatigue  . History of nephrolithiasis  . Adjustment disorder  . Encounter for routine gynecological examination  . Irregular bleeding   Past Medical History  Diagnosis Date  . Nephrolithiasis    No past surgical history on file. History  Substance Use Topics  . Smoking status: Never Smoker   . Smokeless tobacco: Not on file  . Alcohol Use: Not on file   Family History  Problem Relation Age of Onset  . COPD Mother   . COPD Father   . Cancer Father     prostate   No Known Allergies Current Outpatient Prescriptions on File Prior to Visit  Medication Sig Dispense Refill  . escitalopram (LEXAPRO) 20 MG tablet Take 1 tablet (20 mg total) by mouth daily.  30 tablet  1  . medroxyPROGESTERone (DEPO-PROVERA) 150 MG/ML injection Inject 150 mg into the muscle every 3 (three) months.       No current facility-administered medications on file prior to visit.     Review of Systems See HPI Patient reports no  vision/ hearing changes,anorexia, weight change, fever  ,adenopathy, persistant / recurrent hoarseness, swallowing issues, chest pain, edema,persistant / recurrent cough, hemoptysis, dyspnea(rest, exertional, paroxysmal nocturnal), gastrointestinal  bleeding (melena, rectal bleeding), abdominal pain, excessive heart burn, syncope, focal weakness, severe memory loss, concerning skin lesions, depression, anxiety, abnormal bruising/bleeding, major joint swelling, breast masses or abnormal vaginal bleeding.       Objective:   Physical Exam BP 100/68  Pulse 88  Temp(Src) 98.4 F (36.9 C)  Resp 16  Wt 146 lb (66.225 kg)  BMI 25.45 kg/m2  General:  Well-developed,well-nourished,in no acute distress; alert,appropriate and cooperative throughout examination Head:  normocephalic and atraumatic.   Eyes:  vision grossly intact, pupils equal, pupils round, and pupils reactive to light.   Ears:  R ear normal and L ear normal.   Nose:  no external deformity.   Mouth:  good dentition.   Neck:  No deformities, masses, or tenderness noted. Breasts:  No mass, nodules, thickening, tenderness, bulging, retraction, inflamation, nipple discharge or skin changes noted.   Lungs:  Normal respiratory effort, chest expands symmetrically. Lungs are clear to auscultation, no crackles or wheezes. Heart:  Normal rate and regular rhythm. S1 and S2 normal without gallop, murmur, click, rub or other extra sounds. Abdomen:  Bowel sounds positive,abdomen soft and non-tender without masses, organomegaly or hernias noted. Rectal:  no external abnormalities.   Genitalia:  Pelvic Exam:  External: normal female genitalia without lesions or masses        Vagina: normal without lesions or masses        Cervix: normal without lesions or masses        Adnexa: normal bimanual exam without masses or fullness        Uterus: normal by palpation        Pap smear: performed Msk:  No deformity or scoliosis noted of thoracic or lumbar spine.   Extremities:  No clubbing, cyanosis,  edema, or deformity noted with normal full range of motion of all joints.   Neurologic:  alert & oriented X3 and gait normal.   Skin:  Intact without suspicious lesions or rashes Cervical Nodes:  No lymphadenopathy noted Axillary Nodes:  No palpable lymphadenopathy Psych:  Cognition and judgment appear intact. Alert and cooperative with normal attention span and concentration. No apparent delusions, illusions, hallucinations        Assessment & Plan:  1. Adjustment disorder Stable on current dose of Lexapro  2. Encounter for routine gynecological examination Reviewed preventive care protocols, scheduled due services, and updated immunizations Discussed nutrition, exercise, diet, and healthy lifestyle.  - Cytology - PAP  3. Fatigue Recheck labs today. - CBC with Differential - TSH - POCT urine pregnancy  4. Irregular bleeding Unclear etiology- UA neg, U preg negative.  Can sometimes occur for unknown reasons.  If symptoms persist, will repeat Pelvic U/S.  The patient indicates understanding of these issues and agrees with the plan.  - Cytology - PAP - POCT urine pregnancy

## 2012-05-21 NOTE — Telephone Encounter (Signed)
Pt had an apptmt yesterday for a Pap and is trying to reschedule it. Pt is having a discharge, mild stomach pains, and every 2-3 days she starts having blood flow, heavy at times and then it stops. She says she is on depo and should not have periods and has not had inter course so she's not pregnant. Pt needs to come in at 2:45 or later, however, you have nothing on your schedule w/in that time frame until 06/01/2012 and pt feels she needs to be seen sooner than that. Can you accommodate her? Thank you.

## 2012-05-21 NOTE — Patient Instructions (Addendum)
Good to see you, Lindsay Collins. Please go to the lab on your way out. We will let you know as soon as we have your pap smear results.  If your pain and bleeding do not improve, we will repeat your ultrasound.

## 2012-05-21 NOTE — Telephone Encounter (Signed)
Yes ok to see sooner.

## 2012-05-22 ENCOUNTER — Ambulatory Visit: Payer: PRIVATE HEALTH INSURANCE | Admitting: Family Medicine

## 2012-05-22 ENCOUNTER — Encounter: Payer: Self-pay | Admitting: *Deleted

## 2012-05-25 ENCOUNTER — Other Ambulatory Visit: Payer: Self-pay | Admitting: Family Medicine

## 2012-05-25 DIAGNOSIS — N926 Irregular menstruation, unspecified: Secondary | ICD-10-CM

## 2012-06-03 ENCOUNTER — Telehealth: Payer: Self-pay | Admitting: Family Medicine

## 2012-06-03 MED ORDER — ESCITALOPRAM OXALATE 20 MG PO TABS: 20.0000 mg | ORAL_TABLET | Freq: Every day | ORAL | Status: DC

## 2012-06-03 MED ORDER — LORAZEPAM 0.5 MG PO TABS: 0.5000 mg | ORAL_TABLET | Freq: Three times a day (TID) | ORAL | Status: DC | PRN

## 2012-06-03 NOTE — Telephone Encounter (Signed)
Patient came over to me at [facility] and asked for refills for her ativan and lexapro.  Lexapro sent electronically.  Please phone in Ativan rx as entered below.

## 2012-06-03 NOTE — Telephone Encounter (Signed)
Ativan refill phoned in to [facility]

## 2012-06-05 ENCOUNTER — Other Ambulatory Visit: Payer: PRIVATE HEALTH INSURANCE

## 2012-06-18 ENCOUNTER — Telehealth: Payer: Self-pay

## 2012-06-18 NOTE — Telephone Encounter (Signed)
Any side effect is possible with any medication but lexapro and depo provera typically do not cause weight loss.  Has she been more anxious lately?

## 2012-06-18 NOTE — Telephone Encounter (Signed)
If she wants to stop the lexapro:  Please take 1 tablet every other day for 1 week, then 1/2 tablet every other day for 1 week and stop.  I'm glad her stress is improving.

## 2012-06-18 NOTE — Telephone Encounter (Signed)
Left message for call back.

## 2012-06-18 NOTE — Telephone Encounter (Signed)
Advised patient.  She says she has not been more anxious, says she has been feeling better, some of her stress has resolved and she's wondering if perhaps she can stop the lexapro.  Please advise.

## 2012-06-18 NOTE — Telephone Encounter (Signed)
Advised patient as instructed. 

## 2012-06-18 NOTE — Telephone Encounter (Signed)
Pt concerned about weight loss, pts now weighs 143 but her pants are getting very loose and pt does not want to lose weight. Pt eating habits have not changed.Pt does not have abd pain, diarrhea, or vomiting; pt occasionally feels nauseated but has not affected pt eating. Pt wonders if any of her meds could cause weight loss.Please advise.[facility] [place].

## 2012-07-02 ENCOUNTER — Telehealth: Payer: Self-pay | Admitting: *Deleted

## 2012-07-02 NOTE — Telephone Encounter (Signed)
Pt started using hydrocortisone 1% this morning, she will call back tomorrow and let us know how she's doing.

## 2012-07-02 NOTE — Telephone Encounter (Signed)
Pt states she has poison ivy on her leg and is asking if she can have prednisone.  She doesn't want this to spread.

## 2012-07-02 NOTE — Telephone Encounter (Signed)
Ok, I can also send in rx for prescription strength steroid cream if this is not helpful.

## 2012-07-02 NOTE — Telephone Encounter (Signed)
Left message asking patient to call back

## 2012-07-02 NOTE — Telephone Encounter (Signed)
How large is the area?  Typically if small area, we use topical steroid creams.

## 2012-07-02 NOTE — Telephone Encounter (Signed)
Patient states the area is small.  Advised her to use something like hydrocortisone otc.  Offered appt with Dr. Patsy Lager for today but she declined, said she would call back tomorrow if not better.

## 2012-07-03 ENCOUNTER — Other Ambulatory Visit: Payer: Self-pay | Admitting: Family Medicine

## 2012-07-03 MED ORDER — FLUOCINONIDE-E 0.05 % EX CREA
TOPICAL_CREAM | Freq: Two times a day (BID) | CUTANEOUS | Status: DC
Start: 2012-07-03 — End: 2013-06-16

## 2012-07-03 NOTE — Telephone Encounter (Signed)
Saw pt at [facility] this am.  She showed me area on her lower right leg consistent with poison ivy.  OTC hydrocortisone has been ineffective.  Will send in rx for lidex. The patient indicates understanding of these issues and agrees with the plan.

## 2012-07-15 ENCOUNTER — Other Ambulatory Visit: Payer: Self-pay | Admitting: Family Medicine

## 2012-08-26 ENCOUNTER — Telehealth: Payer: Self-pay | Admitting: Family Medicine

## 2012-08-26 NOTE — Telephone Encounter (Signed)
Patient Information:  Caller Name: Yemaya  Phone: [phone]  Patient: Lindsay Collins, Lindsay Collins  Gender: Female  DOB: Feb 15, 1986  Age: 27 Years  PCP: Ruthe Mannan [facility])  Pregnant: No  Office Follow Up:  Does the office need to follow up with this patient?: No  Instructions For The Office: N/A  RN Note:  Depo Provera. Reports vulvar itching.  Noted minimal white discharge without odor when questioned. Denies use of hot tubs or swimming. Offered and declined appointment for 08/27/12.  Stated will try recommendations and call back for appointment if symptoms continue.  Symptoms  Reason For Call & Symptoms: Vulvar itching with minimal white discharge. Denies dysuria, urgency or frequency,  Reviewed Health History In EMR: Yes  Reviewed Medications In EMR: Yes  Reviewed Allergies In EMR: Yes  Reviewed Surgeries / Procedures: Yes  Date of Onset of Symptoms: 08/23/2012 OB / GYN:  LMP: Unknown  Guideline(s) Used:  Vulvar Symptoms  Disposition Per Guideline:   See Within 2 Weeks in Office  Reason For Disposition Reached:   ALL other vulvar symptoms (Exception: feels like prior yeast infection, or rash < 24 hour duration)  Advice Given:  Genital Hygiene:  Keep your genital area clean. Wash daily.  Keep your genital area dry. Wear cotton underwear or underwear with a cotton crotch.  Do not douche.  Do not use feminine hygiene products.  Cleaning:  Wash the area once thoroughly with un-scented soap and water to remove any irritants.  Antifungal Medication for Yeast Infection:   Available in the [place].: Femstat-3, miconazole (Monistat-3), clotrimazole (Gyne-Lotrimin-3, Mycelex-7), butoconazole (Femstat-3).  Do not use yeast medication during the 24 hours prior to a physician appointment (Reason: interferes with examination).  Expected Course:  If there is no improvement within 3 days, then you will need to be examined.  Call Back If:  Fever occurs  Yellow or green vaginal  discharge occurs  No improvement in "yeast infection" within 3 days  You become worse.  RN Overrode Recommendation:  Follow Up With Office Later  Preferred to call back for appointment.

## 2012-10-27 ENCOUNTER — Emergency Department: Payer: Self-pay | Admitting: Emergency Medicine

## 2012-11-02 ENCOUNTER — Telehealth: Payer: Self-pay

## 2012-11-02 NOTE — Telephone Encounter (Signed)
Pt works at [employer] and has been exposed to scabies; pt has been itching and wants to know if med can be given; advised pt she needs to be seen; pt said she will look for Dr Dayton Martes today at [facility].

## 2012-12-28 ENCOUNTER — Encounter: Payer: Self-pay | Admitting: Family Medicine

## 2012-12-28 ENCOUNTER — Telehealth: Payer: Self-pay | Admitting: *Deleted

## 2012-12-28 ENCOUNTER — Ambulatory Visit (INDEPENDENT_AMBULATORY_CARE_PROVIDER_SITE_OTHER): Payer: Commercial Managed Care - PPO | Admitting: Family Medicine

## 2012-12-28 VITALS — BP 106/80 | HR 69 | Temp 98.9°F | Ht 63.25 in | Wt 141.8 lb

## 2012-12-28 DIAGNOSIS — R5383 Other fatigue: Secondary | ICD-10-CM

## 2012-12-28 DIAGNOSIS — R5381 Other malaise: Secondary | ICD-10-CM

## 2012-12-28 DIAGNOSIS — Z0000 Encounter for general adult medical examination without abnormal findings: Secondary | ICD-10-CM

## 2012-12-28 DIAGNOSIS — Z136 Encounter for screening for cardiovascular disorders: Secondary | ICD-10-CM

## 2012-12-28 DIAGNOSIS — F4320 Adjustment disorder, unspecified: Secondary | ICD-10-CM

## 2012-12-28 LAB — CBC WITH DIFFERENTIAL/PLATELET
Basophils Relative: 0.5 % (ref 0.0–3.0)
Hemoglobin: 14.3 g/dL (ref 12.0–15.0)
Lymphocytes Relative: 35.1 % (ref 12.0–46.0)
MCHC: 34.5 g/dL (ref 30.0–36.0)
Monocytes Relative: 7.1 % (ref 3.0–12.0)
Neutro Abs: 3.1 10*3/uL (ref 1.4–7.7)
RBC: 4.48 Mil/uL (ref 3.87–5.11)

## 2012-12-28 LAB — COMPREHENSIVE METABOLIC PANEL
Albumin: 4.8 g/dL (ref 3.5–5.2)
CO2: 26 mEq/L (ref 19–32)
GFR: 84.79 mL/min (ref >60.00)
Glucose, Bld: 90 mg/dL (ref 70–99)
Potassium: 4.3 mEq/L (ref 3.5–5.1)
Sodium: 137 mEq/L (ref 135–145)
Total Protein: 7.3 g/dL (ref 6.0–8.3)

## 2012-12-28 LAB — LIPID PANEL: Cholesterol: 150 mg/dL (ref 0–200)

## 2012-12-28 MED ORDER — CETIRIZINE HCL 10 MG PO TABS: 10.0000 mg | ORAL_TABLET | Freq: Every day | ORAL | Status: DC

## 2012-12-28 MED ORDER — LORAZEPAM 0.5 MG PO TABS: 0.5000 mg | ORAL_TABLET | Freq: Three times a day (TID) | ORAL | Status: DC | PRN

## 2012-12-28 NOTE — Telephone Encounter (Signed)
Rx refilled.

## 2012-12-28 NOTE — Progress Notes (Signed)
Subjective:    Patient ID: Lindsay Collins, female    DOB: Jun 23, 1985, 27 y.o.   MRN: [id number]  HPI  Very pleasant 27 yo G2P2 here for CPX without complaints.  Last pap smear was normal and done by me in 05/2012. Does have a new sexual partner.  Started dating someone 6 months ago. Feels it is a healthy relationship.  Denies any vaginal discharge or pelvic pain.  Remains on depo provera.  Fatigue- remains fatigued.  CBC has been stable and hematology felt low WBC was reactive.  B12 normal. Lab Results  Component Value Date   WBC 4.6 05/21/2012   HGB 12.9 05/21/2012   HCT 37.6 05/21/2012   MCV 91.6 05/21/2012   PLT 240.0 05/21/2012     No longer taking Lexapro and takes very occasional Ativan. Denies any depression or regular anxiety.   Patient Active Problem List   Diagnosis Date Noted  . Routine general medical examination at a health care facility 12/28/2012  . Encounter for routine gynecological examination 05/21/2012  . Irregular bleeding 05/21/2012  . Adjustment disorder 03/13/2012  . Fatigue 10/08/2011  . History of nephrolithiasis 10/08/2011   Past Medical History  Diagnosis Date  . Nephrolithiasis    No past surgical history on file. History  Substance Use Topics  . Smoking status: Never Smoker   . Smokeless tobacco: Not on file  . Alcohol Use: No   Family History  Problem Relation Age of Onset  . COPD Mother   . COPD Father   . Cancer Father     prostate   No Known Allergies Current Outpatient Prescriptions on File Prior to Visit  Medication Sig Dispense Refill  . medroxyPROGESTERone (DEPO-PROVERA) 150 MG/ML injection Inject 150 mg into the muscle every 3 (three) months.      . fluocinonide-emollient (LIDEX-E) 0.05 % cream Apply topically 2 (two) times daily.  30 g  0   No current facility-administered medications on file prior to visit.     Review of Systems See HPI Patient reports no  vision/ hearing changes,anorexia, weight change, fever  ,adenopathy, persistant / recurrent hoarseness, swallowing issues, chest pain, edema,persistant / recurrent cough, hemoptysis, dyspnea(rest, exertional, paroxysmal nocturnal), gastrointestinal  bleeding (melena, rectal bleeding), abdominal pain, excessive heart burn, syncope, focal weakness, severe memory loss, concerning skin lesions, depression, anxiety, abnormal bruising/bleeding, major joint swelling, breast masses or abnormal vaginal bleeding.       Objective:   Physical Exam BP 106/80  Pulse 69  Temp(Src) 98.9 F (37.2 C) (Oral)  Ht 5' 3.25" (1.607 m)  Wt 141 lb 12.8 oz (64.32 kg)  BMI 24.91 kg/m2  SpO2 98%  General:  Well-developed,well-nourished,in no acute distress; alert,appropriate and cooperative throughout examination Head:  normocephalic and atraumatic.   Eyes:  vision grossly intact, pupils equal, pupils round, and pupils reactive to light.   Ears:  R ear normal and L ear normal.   Nose:  no external deformity.   Mouth:  good dentition.   Neck:  No deformities, masses, or tenderness noted. Lungs:  Normal respiratory effort, chest expands symmetrically. Lungs are clear to auscultation, no crackles or wheezes. Heart:  Normal rate and regular rhythm. S1 and S2 normal without gallop, murmur, click, rub or other extra sounds. Abdomen:  Bowel sounds positive,abdomen soft and non-tender without masses, organomegaly or hernias noted. Msk:  No deformity or scoliosis noted of thoracic or lumbar spine.   Extremities:  No clubbing, cyanosis, edema, or deformity noted with normal full range of motion  of all joints.   Neurologic:  alert & oriented X3 and gait normal.   Skin:  Intact without suspicious lesions or rashes Cervical Nodes:  No lymphadenopathy noted Axillary Nodes:  No palpable lymphadenopathy Psych:  Cognition and judgment appear intact. Alert and cooperative with normal attention span and concentration. No apparent delusions, illusions, hallucinations         Assessment & Plan:  1. Routine general medical examination at a health care facility Reviewed preventive care protocols, scheduled due services, and updated immunizations Discussed nutrition, exercise, diet, and healthy lifestyle. Will receive flu shot at work next month. - CBC with Differential - Comprehensive metabolic panel  2. Adjustment disorder Stable off SSRI.  Feels work and home stressors have improved.  3. Fatigue Likely due to busy life style.  Will recheck labs today. - TSH - Vitamin B12  4. Screening for ischemic heart disease  - Lipid Panel

## 2012-12-28 NOTE — Telephone Encounter (Signed)
Patient called stating that she wants a script sent to the pharmacy for Zyrtec because it will be cheaper for her if it is done this way. Medication added to med sheet.

## 2012-12-28 NOTE — Patient Instructions (Signed)
Great to see you. We will repeat a pap smear after February 2015.  I will call you with your lab results.

## 2013-01-08 ENCOUNTER — Telehealth: Payer: Self-pay | Admitting: *Deleted

## 2013-01-08 ENCOUNTER — Other Ambulatory Visit: Payer: Self-pay | Admitting: Family Medicine

## 2013-01-08 MED ORDER — LEVOCETIRIZINE DIHYDROCHLORIDE 5 MG PO TABS: 5.0000 mg | ORAL_TABLET | Freq: Every evening | ORAL | Status: DC

## 2013-01-08 MED ORDER — CYCLOBENZAPRINE HCL 5 MG PO TABS: 5.0000 mg | ORAL_TABLET | Freq: Three times a day (TID) | ORAL | Status: DC | PRN

## 2013-01-08 NOTE — Telephone Encounter (Signed)
eRx sent to her pharmacy.

## 2013-01-08 NOTE — Telephone Encounter (Signed)
Notified pt. 

## 2013-01-08 NOTE — Telephone Encounter (Signed)
Ins denied previously prescribed allergy med, but pharmacy believes ins will cover Xyzal. Pt is requesting rx for it sent in and a call back to let her know if rx was sent in.

## 2013-01-19 ENCOUNTER — Telehealth: Payer: Self-pay

## 2013-01-19 NOTE — Telephone Encounter (Signed)
Patient advised.

## 2013-01-19 NOTE — Telephone Encounter (Signed)
Pt said since 2 pm today pt has felt nauseated; pt has tried [employer] with no relief. no vomiting and no abd pain. Pt said she has irreg menstrual periods due to taking Depo. And is not sure if that caused the nausea. No fever and no diarrhea. [facility] [place].

## 2013-01-19 NOTE — Telephone Encounter (Signed)
She can try OTC meclizine for now.  If that isn't helping or if she gets worse then we may need to see her in the office.  I would try the meclizine first.  Thanks.

## 2013-01-27 ENCOUNTER — Other Ambulatory Visit: Payer: Self-pay | Admitting: Internal Medicine

## 2013-01-27 ENCOUNTER — Telehealth: Payer: Self-pay

## 2013-01-27 DIAGNOSIS — R059 Cough, unspecified: Secondary | ICD-10-CM

## 2013-01-27 DIAGNOSIS — R05 Cough: Secondary | ICD-10-CM

## 2013-01-27 MED ORDER — HYDROCODONE-HOMATROPINE 5-1.5 MG/5ML PO SYRP: 5.0000 mL | ORAL_SOLUTION | Freq: Three times a day (TID) | ORAL | Status: DC | PRN

## 2013-01-27 NOTE — Telephone Encounter (Signed)
Pt said saw Nicki Reaper at pt's work place today and pt is requesting a cough med; Robitussin not helping cough. Prod cough with clear to green phlegm. No fever. [facility].Pt request answer to question now; if needs to pick up rx at [facility] pt is leaving work at [employer] advise. Nicki Reaper NP said pt can come by office for Hycodan rx.pt voiced understanding.

## 2013-01-27 NOTE — Telephone Encounter (Signed)
rx done

## 2013-03-09 ENCOUNTER — Other Ambulatory Visit: Payer: Self-pay | Admitting: Internal Medicine

## 2013-03-09 DIAGNOSIS — Z0000 Encounter for general adult medical examination without abnormal findings: Secondary | ICD-10-CM

## 2013-03-10 ENCOUNTER — Other Ambulatory Visit (INDEPENDENT_AMBULATORY_CARE_PROVIDER_SITE_OTHER): Payer: Commercial Managed Care - PPO

## 2013-03-10 DIAGNOSIS — Z0000 Encounter for general adult medical examination without abnormal findings: Secondary | ICD-10-CM

## 2013-03-10 LAB — TSH: TSH: 0.99 u[IU]/mL (ref 0.35–5.50)

## 2013-03-10 LAB — COMPREHENSIVE METABOLIC PANEL
ALT: 13 U/L (ref 0–35)
AST: 16 U/L (ref 0–37)
BUN: 12 mg/dL (ref 6–23)
CO2: 25 mEq/L (ref 19–32)
Calcium: 9.2 mg/dL (ref 8.4–10.5)
Chloride: 106 mEq/L (ref 96–112)
Creatinine, Ser: 0.9 mg/dL (ref 0.4–1.2)
GFR: 78.26 mL/min (ref >60.00)
Potassium: 4.2 mEq/L (ref 3.5–5.1)
Sodium: 139 mEq/L (ref 135–145)
Total Protein: 7.2 g/dL (ref 6.0–8.3)

## 2013-03-10 LAB — LIPID PANEL
HDL: 64.8 mg/dL (ref >39.00)
Total CHOL/HDL Ratio: 2
Triglycerides: 26 mg/dL (ref 0.0–149.0)

## 2013-03-10 LAB — CBC
Hemoglobin: 13.8 g/dL (ref 12.0–15.0)
MCHC: 34.7 g/dL (ref 30.0–36.0)
RDW: 13.3 % (ref 11.5–14.6)
WBC: 4.4 10*3/uL — ABNORMAL LOW (ref 4.5–10.5)

## 2013-03-11 ENCOUNTER — Telehealth: Payer: Self-pay | Admitting: Internal Medicine

## 2013-03-11 NOTE — Telephone Encounter (Signed)
Wbc count was slightly low but not stable from previous years. All other labs were normal

## 2013-03-11 NOTE — Telephone Encounter (Signed)
Patient is calling to get the results of her lab work done yesterday.  She'd like to know the results as soon as possible.

## 2013-03-12 ENCOUNTER — Telehealth: Payer: Self-pay

## 2013-03-12 NOTE — Telephone Encounter (Signed)
Pt is aware of labs and lab letter mailed

## 2013-03-12 NOTE — Telephone Encounter (Signed)
Pt is aware of lab results and labs mailed

## 2013-04-01 HISTORY — PX: ABDOMINAL HYSTERECTOMY: SHX81

## 2013-04-07 ENCOUNTER — Telehealth: Payer: Self-pay | Admitting: Family Medicine

## 2013-04-07 NOTE — Telephone Encounter (Signed)
[facility] Triage Call Report Triage Record Num: [id number] Operator: Aundra MilletVicky Varner Patient Name: Lindsay Collins Call Date & Time: 04/06/2013 5:35:11PM Patient Phone: [phone] PCP: Ruthe Mannanalia Aron Patient Gender: Female PCP Fax : [phone] Patient DOB: 04-28-1985 [facility] Reason for Call: Caller: Mahasin/Father; PCP: Ruthe MannanAron, Talia (Family Practice); CB#: [phone] LMP - Depo --Today, 04/06/2013, pt calling with c/o of cough since 04/03/2013 that is mainly dry. Some green nasal congestion this morning with some post nasal drainage during the day. No fever. She has been taking OTC Mucinex and Robitussin. RN reached See in 24 hours for sinus pressure and green discharge from nose/ back of throat per Cough - Adult protocol . Care advice given and RN attempted to schedule OV for 04/07/2013 , but her kids have an appt and will call office back tomorrow. RN advised Mucinex, Robitussin with Tylenol or Advil for sinus pain per package instructions. Protocol(s) Used: Cough - Adult Recommended Outcome per Protocol: See Provider within 24 hours Reason for Outcome: Pressure/pain above or below eyes, near ears over sinuses (may also be described as fullness, worsens when bending forward, teeth or eye pain) AND yellow-green drainage from nose or down back of throat. Care Advice: ~ Use a cool mist humidifier to moisten air. Be sure to clean according to manufacturer's instructions. ~ Consider use of a saline nasal spray per package directions to help relieve nasal congestion. ~ SYMPTOM / CONDITION MANAGEMENT A warm, moist compress placed on face, over eyes for 15 to 20 minutes, 5 to 6 times a day, may help relieve the congestion. ~ Analgesic/Antipyretic Advice - Acetaminophen: Consider acetaminophen as directed on label or by pharmacist/provider for pain or fever PRECAUTIONS: - Use if there is no history of liver disease, alcoholism, or intake of three or more alcohol  drinks per day - Only if approved by provider during pregnancy or when breastfeeding - During pregnancy, acetaminophen should not be taken more than 3 consecutive days without telling provider - Do not exceed recommended dose or frequency ~ Analgesic/Antipyretic Advice - NSAIDs: Consider aspirin, ibuprofen, naproxen or ketoprofen for pain or fever as directed on label or by pharmacist/provider. PRECAUTIONS: - If over 29 years of age, should not take longer than 1 week without consulting provider. EXCEPTIONS: - Should not be used if taking blood thinners or have bleeding problems. - Do not use if have history of sensitivity/allergy to any of these medications; or history of cardiovascular, ulcer, kidney, liver disease or diabetes unless approved by provider. - Do not exceed recommended dose or frequency. ~ 04/06/2013 5:45:08PM Page 1 of 1 CAN_TriageRpt_V2

## 2013-05-05 ENCOUNTER — Ambulatory Visit: Payer: Commercial Managed Care - PPO | Admitting: Family Medicine

## 2013-05-30 LAB — HM PAP SMEAR

## 2013-06-02 ENCOUNTER — Telehealth: Payer: Self-pay | Admitting: Family Medicine

## 2013-06-02 NOTE — Telephone Encounter (Signed)
I called patient again to see where we could work her in tomorrow and she said she didn't know and she would call Lindsay Collins back.

## 2013-06-02 NOTE — Telephone Encounter (Signed)
I have been trying to discourage seeing staff at [facility] there- I cant document the visit  and so we should really encourage them to come to the office.  We can always work her tomorrow if she needs to be seen.

## 2013-06-02 NOTE — Telephone Encounter (Signed)
Dr. Dayton MartesAron, patient states she is sick and running a little bit of a fever.  She wanted to know if she should come here or just see you when you come to the nursing home.  I told her you would probably prefer to have an actual appointment with her, but that we are 100% booked today.  She said she would talk to you once you got to the nursing home.

## 2013-06-02 NOTE — Telephone Encounter (Signed)
Noted! Thank you

## 2013-06-11 ENCOUNTER — Ambulatory Visit: Payer: Commercial Managed Care - PPO | Admitting: Family Medicine

## 2013-06-11 ENCOUNTER — Other Ambulatory Visit: Payer: Self-pay | Admitting: Family Medicine

## 2013-06-11 ENCOUNTER — Other Ambulatory Visit (INDEPENDENT_AMBULATORY_CARE_PROVIDER_SITE_OTHER): Payer: Commercial Managed Care - PPO

## 2013-06-11 DIAGNOSIS — Z0000 Encounter for general adult medical examination without abnormal findings: Secondary | ICD-10-CM

## 2013-06-11 DIAGNOSIS — Z136 Encounter for screening for cardiovascular disorders: Secondary | ICD-10-CM

## 2013-06-11 LAB — CBC WITH DIFFERENTIAL/PLATELET
BASOS ABS: 0 10*3/uL (ref 0.0–0.1)
Basophils Relative: 0.7 % (ref 0.0–3.0)
Eosinophils Absolute: 0 10*3/uL (ref 0.0–0.7)
Eosinophils Relative: 0.9 % (ref 0.0–5.0)
HEMATOCRIT: 38.9 % (ref 36.0–46.0)
Hemoglobin: 13.2 g/dL (ref 12.0–15.0)
LYMPHS ABS: 1.4 10*3/uL (ref 0.7–4.0)
Lymphocytes Relative: 36 % (ref 12.0–46.0)
MCHC: 33.9 g/dL (ref 30.0–36.0)
MCV: 93.1 fl (ref 78.0–100.0)
Monocytes Absolute: 0.3 10*3/uL (ref 0.1–1.0)
Monocytes Relative: 8.4 % (ref 3.0–12.0)
Neutro Abs: 2.1 10*3/uL (ref 1.4–7.7)
Neutrophils Relative %: 54 % (ref 43.0–77.0)
PLATELETS: 230 10*3/uL (ref 150.0–400.0)
RBC: 4.17 Mil/uL (ref 3.87–5.11)
RDW: 12.9 % (ref 11.5–14.6)
WBC: 4 10*3/uL — ABNORMAL LOW (ref 4.5–10.5)

## 2013-06-11 LAB — COMPREHENSIVE METABOLIC PANEL
ALT: 12 U/L (ref 0–35)
AST: 15 U/L (ref 0–37)
Albumin: 4.6 g/dL (ref 3.5–5.2)
Alkaline Phosphatase: 59 U/L (ref 39–117)
BILIRUBIN TOTAL: 0.7 mg/dL (ref 0.3–1.2)
BUN: 16 mg/dL (ref 6–23)
CO2: 26 meq/L (ref 19–32)
Calcium: 9.6 mg/dL (ref 8.4–10.5)
Chloride: 107 mEq/L (ref 96–112)
Creatinine, Ser: 1 mg/dL (ref 0.4–1.2)
GFR: 68.48 mL/min (ref >60.00)
Glucose, Bld: 95 mg/dL (ref 70–99)
Potassium: 4.3 mEq/L (ref 3.5–5.1)
SODIUM: 140 meq/L (ref 135–145)
Total Protein: 6.9 g/dL (ref 6.0–8.3)

## 2013-06-11 LAB — LIPID PANEL
CHOL/HDL RATIO: 3
Cholesterol: 147 mg/dL (ref 0–200)
HDL: 56.3 mg/dL (ref >39.00)
LDL Cholesterol: 84 mg/dL (ref 0–99)
Triglycerides: 36 mg/dL (ref 0.0–149.0)
VLDL: 7.2 mg/dL (ref 0.0–40.0)

## 2013-06-11 LAB — TSH: TSH: 0.94 u[IU]/mL (ref 0.35–5.50)

## 2013-06-16 ENCOUNTER — Other Ambulatory Visit (HOSPITAL_COMMUNITY)
Admission: RE | Admit: 2013-06-16 | Discharge: 2013-06-16 | Disposition: A | Discharge status: Home / Self Care | Payer: Commercial Managed Care - PPO | Source: Ambulatory Visit | Attending: Family Medicine | Admitting: Family Medicine

## 2013-06-16 ENCOUNTER — Encounter: Payer: Self-pay | Admitting: Family Medicine

## 2013-06-16 ENCOUNTER — Ambulatory Visit (INDEPENDENT_AMBULATORY_CARE_PROVIDER_SITE_OTHER): Payer: Commercial Managed Care - PPO | Admitting: Family Medicine

## 2013-06-16 VITALS — BP 110/64 | HR 68 | Temp 98.5°F | Ht 63.5 in | Wt 142.5 lb

## 2013-06-16 DIAGNOSIS — Z113 Encounter for screening for infections with a predominantly sexual mode of transmission: Secondary | ICD-10-CM | POA: Insufficient documentation

## 2013-06-16 DIAGNOSIS — R87810 Cervical high risk human papillomavirus (HPV) DNA test positive: Secondary | ICD-10-CM | POA: Insufficient documentation

## 2013-06-16 DIAGNOSIS — Z124 Encounter for screening for malignant neoplasm of cervix: Secondary | ICD-10-CM | POA: Insufficient documentation

## 2013-06-16 DIAGNOSIS — Z01419 Encounter for gynecological examination (general) (routine) without abnormal findings: Secondary | ICD-10-CM

## 2013-06-16 DIAGNOSIS — F4320 Adjustment disorder, unspecified: Secondary | ICD-10-CM

## 2013-06-16 DIAGNOSIS — Z1151 Encounter for screening for human papillomavirus (HPV): Secondary | ICD-10-CM | POA: Insufficient documentation

## 2013-06-16 DIAGNOSIS — N760 Acute vaginitis: Secondary | ICD-10-CM | POA: Insufficient documentation

## 2013-06-16 MED ORDER — LORAZEPAM 1 MG PO TABS: ORAL_TABLET | ORAL | Status: DC

## 2013-06-16 NOTE — Progress Notes (Signed)
Pre visit review using our clinic review tool, if applicable. No additional management support is needed unless otherwise documented below in the visit note. 

## 2013-06-16 NOTE — Assessment & Plan Note (Signed)
Pap smear today. If normal, ok to space out to every 2 years. Discussed this with Helmut MusterAlicia.

## 2013-06-16 NOTE — Progress Notes (Signed)
Subjective:    Patient ID: Lindsay Collins, female    DOB: 1986/03/30, 28 y.o.   MRN: [id number]  HPI  Very pleasant 28 yo G2P2 here for GYN exam without complaints.  Last pap smear was normal and done by me in 05/21/2012.  Dating someone new- coming up on 1 year anniversary. Feels it is a healthy relationship.  Denies any vaginal discharge or pelvic pain.    Remains on depo provera.  Does have a little light spotting the week before period.   Lab Results  Component Value Date   WBC 4.0* 06/11/2013   HGB 13.2 06/11/2013   HCT 38.9 06/11/2013   MCV 93.1 06/11/2013   PLT 230.0 06/11/2013     No longer taking Lexapro and takes very occasional Ativan.  Lately has been a little more anxious.  Having some behavioral issues with her sons and she is a single mom.  Denies any depression or regular anxiety.   No SI or HI. Lab Results  Component Value Date   CHOL 147 06/11/2013   HDL 56.30 06/11/2013   LDLCALC 84 06/11/2013   TRIG 36.0 06/11/2013   CHOLHDL 3 06/11/2013   Lab Results  Component Value Date   NA 140 06/11/2013   K 4.3 06/11/2013   CL 107 06/11/2013   CO2 26 06/11/2013   Lab Results  Component Value Date   CREATININE 1.0 06/11/2013     Patient Active Problem List   Diagnosis Date Noted  . Routine general medical examination at a health care facility 12/28/2012  . Encounter for routine gynecological examination 05/21/2012  . Irregular bleeding 05/21/2012  . Adjustment disorder 03/13/2012  . Fatigue 10/08/2011  . History of nephrolithiasis 10/08/2011   Past Medical History  Diagnosis Date  . Nephrolithiasis    No past surgical history on file. History  Substance Use Topics  . Smoking status: Never Smoker   . Smokeless tobacco: Not on file  . Alcohol Use: No   Family History  Problem Relation Age of Onset  . COPD Mother   . COPD Father   . Cancer Father     prostate   No Known Allergies Current Outpatient Prescriptions on File Prior to Visit  Medication  Sig Dispense Refill  . medroxyPROGESTERone (DEPO-PROVERA) 150 MG/ML injection Inject 150 mg into the muscle every 3 (three) months.       No current facility-administered medications on file prior to visit.     Review of Systems See HPI Patient reports no  vision/ hearing changes,anorexia, weight change, fever ,adenopathy, persistant / recurrent hoarseness, swallowing issues, chest pain, edema,persistant / recurrent cough, hemoptysis, dyspnea(rest, exertional, paroxysmal nocturnal), gastrointestinal  bleeding (melena, rectal bleeding), abdominal pain, excessive heart burn, syncope, focal weakness, severe memory loss, concerning skin lesions, depression,  abnormal bruising/bleeding, major joint swelling, breast masses or abnormal vaginal bleeding.       Objective:   Physical Exam BP 110/64  Pulse 68  Temp(Src) 98.5 F (36.9 C) (Oral)  Ht 5' 3.5" (1.613 m)  Wt 142 lb 8 oz (64.638 kg)  BMI 24.84 kg/m2  SpO2 97%   General:  Well-developed,well-nourished,in no acute distress; alert,appropriate and cooperative throughout examination Head:  normocephalic and atraumatic.   Eyes:  vision grossly intact, pupils equal, pupils round, and pupils reactive to light.   Ears:  R ear normal and L ear normal.   Nose:  no external deformity.   Mouth:  good dentition.   Neck:  No deformities, masses, or tenderness noted.  Breasts:  No mass, nodules, thickening, tenderness, bulging, retraction, inflamation, nipple discharge or skin changes noted.   Abdomen:  Bowel sounds positive,abdomen soft and non-tender without masses, organomegaly or hernias noted. Rectal:  no external abnormalities.   Genitalia:  Pelvic Exam:        External: normal female genitalia without lesions or masses        Vagina: normal without lesions or masses        Cervix: normal without lesions or masses        Adnexa: normal bimanual exam without masses or fullness        Uterus: normal by palpation        Pap smear:  performed Msk:  No deformity or scoliosis noted of thoracic or lumbar spine.  Extremities:  No clubbing, cyanosis, edema, or deformity noted with normal full range of motion of all joints.   Neurologic:  alert & oriented X3 and gait normal.   Skin:  Intact without suspicious lesions or rashes Axillary Nodes:  No palpable lymphadenopathy Psych:  Cognition and judgment appear intact. Alert and cooperative with normal attention span and concentration. No apparent delusions, illusions, hallucinations         Assessment & Plan:

## 2013-06-16 NOTE — Assessment & Plan Note (Signed)
Deteriorated. Rx for ativan refilled. She is aware of addiction and sedation potential.

## 2013-06-17 ENCOUNTER — Ambulatory Visit: Payer: Commercial Managed Care - PPO | Admitting: Family Medicine

## 2013-06-17 LAB — CERVICOVAGINAL ANCILLARY ONLY
Bacterial vaginitis: NEGATIVE
CANDIDA VAGINITIS: NEGATIVE

## 2013-06-21 LAB — CERVICOVAGINAL ANCILLARY ONLY: Herpes: NEGATIVE

## 2013-06-23 ENCOUNTER — Other Ambulatory Visit (HOSPITAL_COMMUNITY)
Admission: RE | Admit: 2013-06-23 | Discharge: 2013-06-23 | Disposition: A | Discharge status: Home / Self Care | Payer: Commercial Managed Care - PPO | Source: Ambulatory Visit | Attending: Family Medicine | Admitting: Family Medicine

## 2013-06-23 ENCOUNTER — Other Ambulatory Visit: Payer: Self-pay | Admitting: Family Medicine

## 2013-06-23 DIAGNOSIS — R87612 Low grade squamous intraepithelial lesion on cytologic smear of cervix (LGSIL): Secondary | ICD-10-CM

## 2013-06-25 ENCOUNTER — Ambulatory Visit: Payer: Commercial Managed Care - PPO | Admitting: Family Medicine

## 2013-07-07 ENCOUNTER — Telehealth: Payer: Self-pay | Admitting: Family Medicine

## 2013-07-07 NOTE — Telephone Encounter (Signed)
Spoke to pt and attempted to assist. Pt states that she has "another way" to contact Dr Dayton MartesAron and will do so that way

## 2013-07-07 NOTE — Telephone Encounter (Signed)
Pt would like to speak with Dr. Dayton MartesAron about some test results she got back. I told her she may need an appointment but I would send a note back to see if Dr. Dayton MartesAron would be able to call her Please advise.

## 2013-08-13 IMAGING — CR DG ABDOMEN 1V
1 series · 2 of 2 positions shown · non-contrast
Comparison: none

REASON FOR EXAM: kidney stone
COMMENTS:

[Series 1: view not recorded · 0.17mm/px · 2 of 2 slices shown]
[im 1/2]
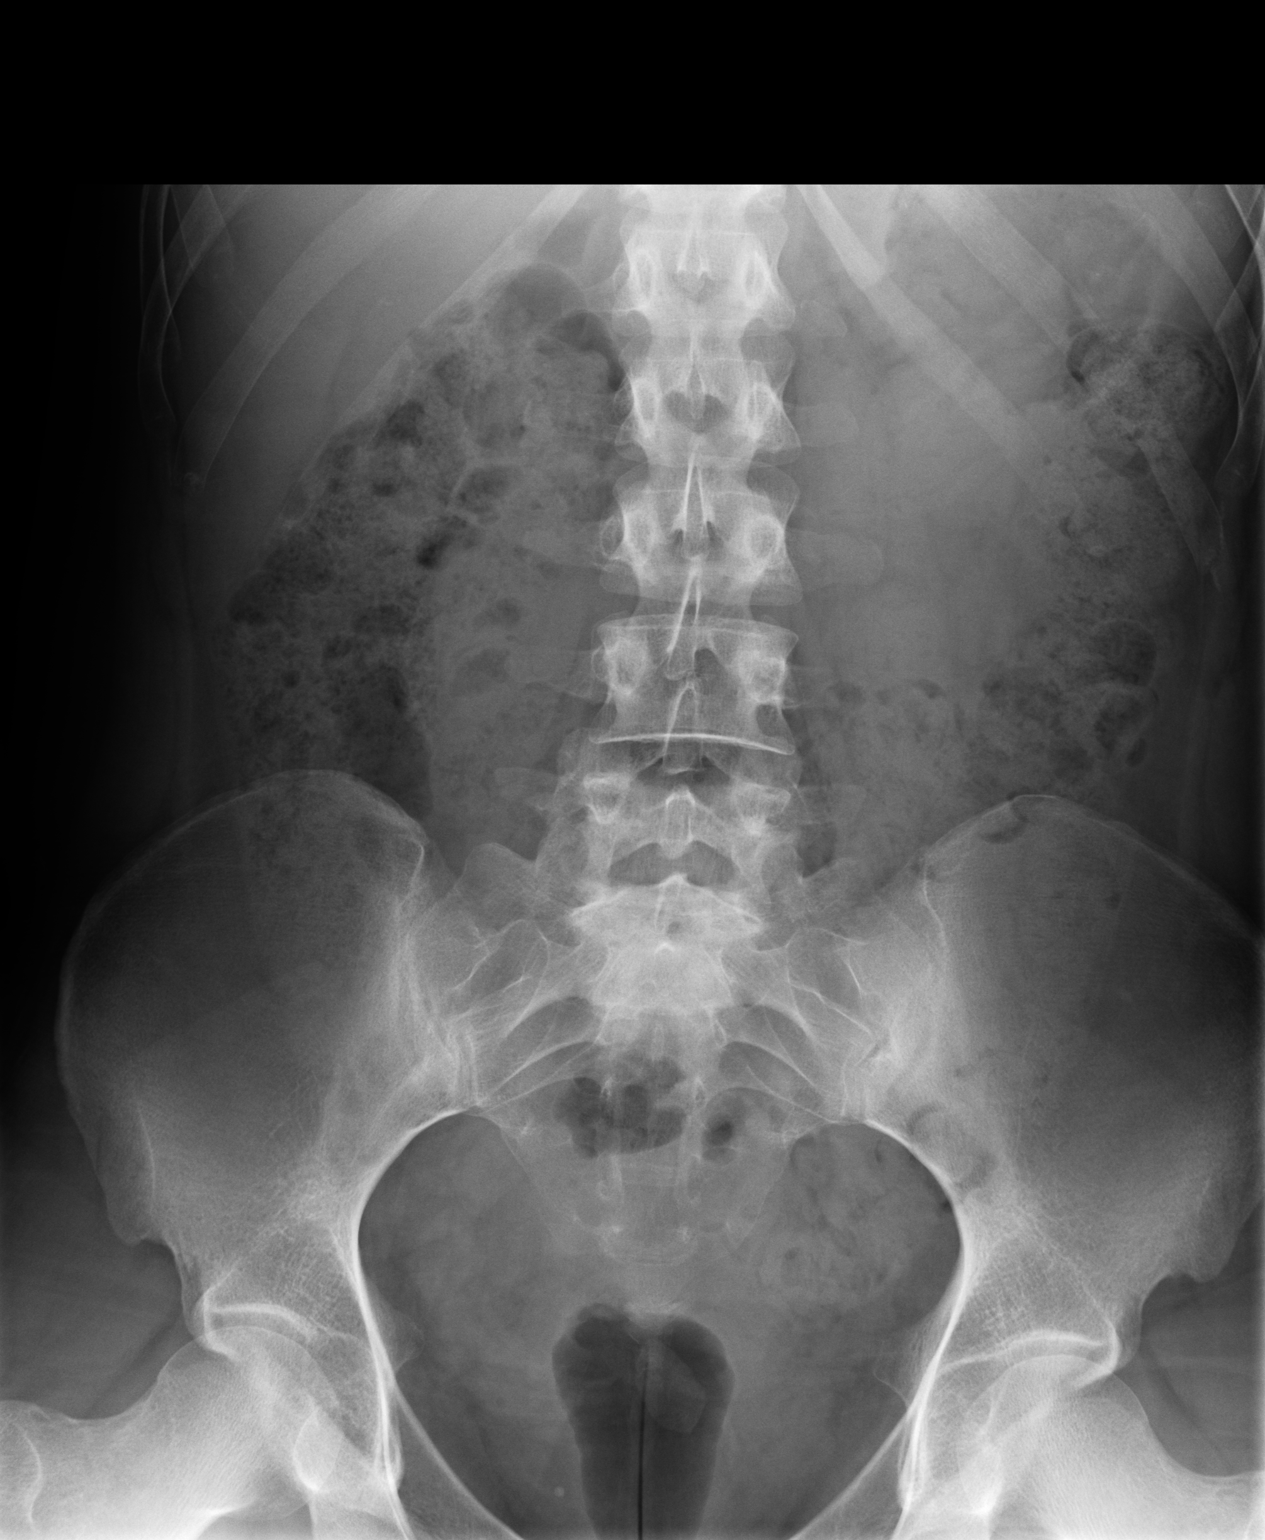
[im 2/2]
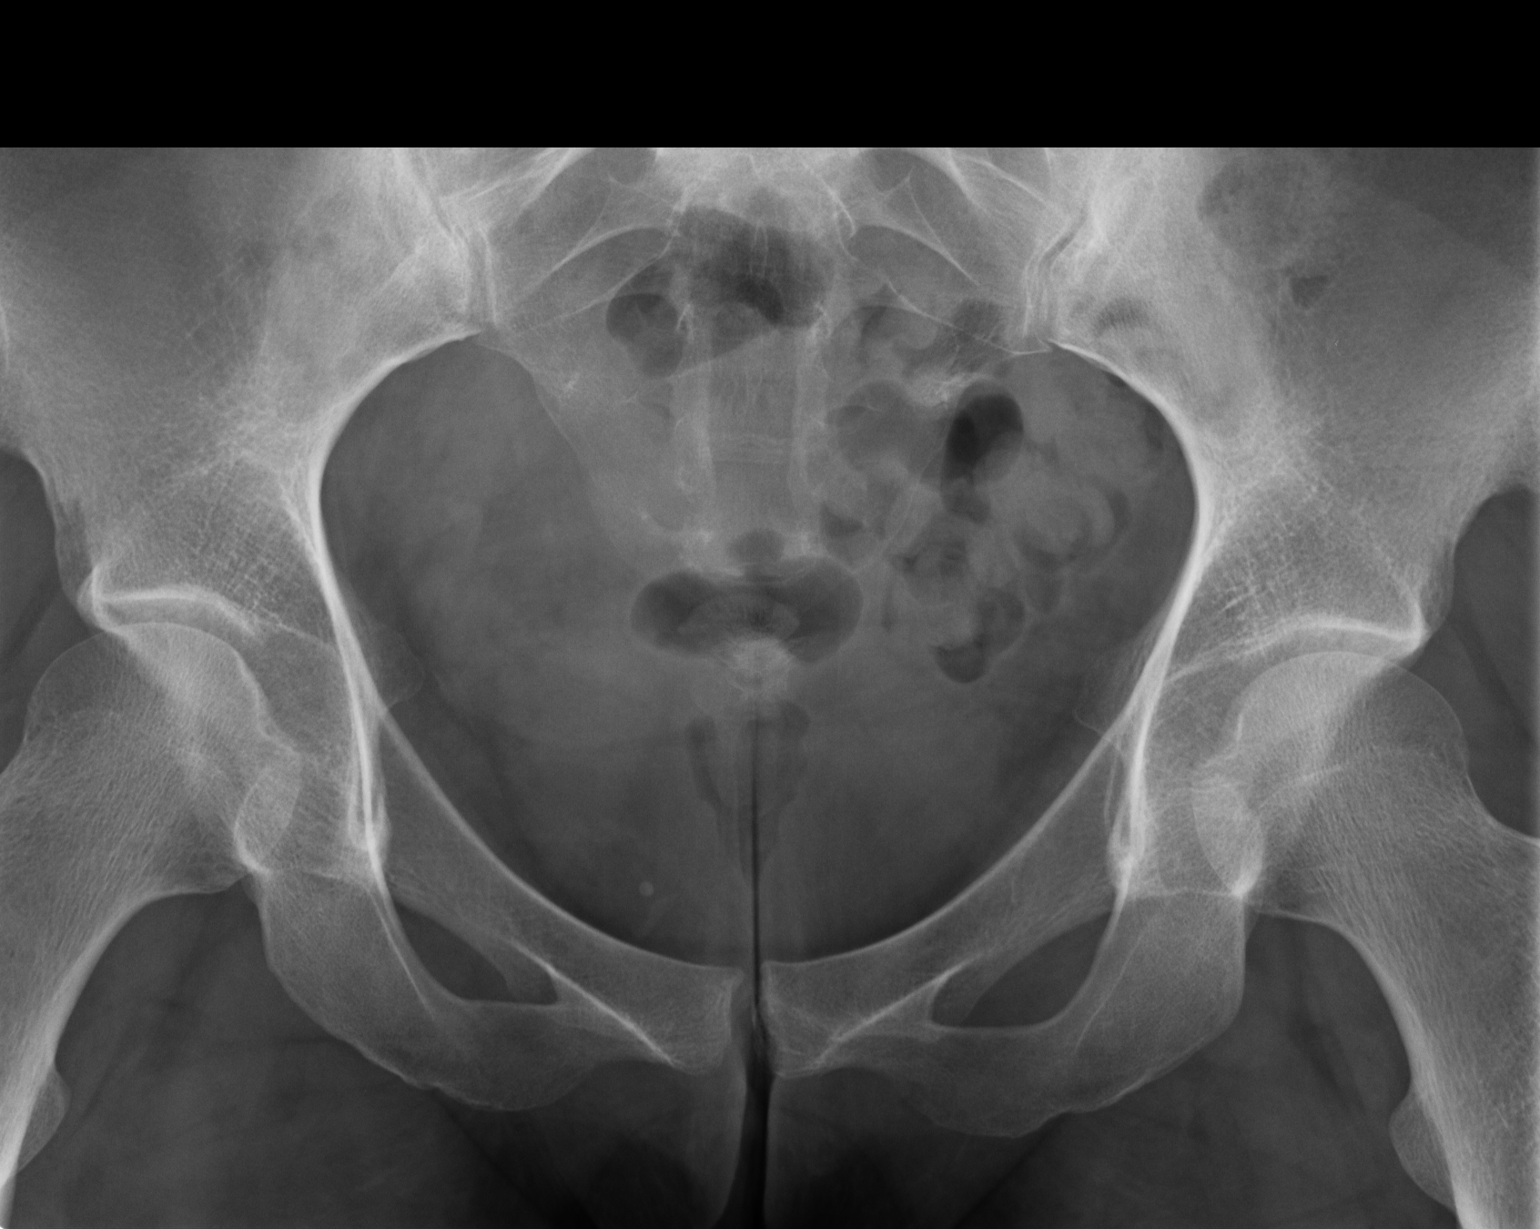

[2 of 2 positions shown; findings below may reference images not displayed]

PROCEDURE:     MDR - MDR KIDNEY URETER BLADDER  - January 28, 2011 [DATE]

RESULT:     An AP view of the abdomen shows a large amount of fecal material
in the colon. No definite renal or ureteral calcifications are identified.
Small stones or faint stones could be present and obscured. Incidental note
is made of a phlebolith low in the right pelvis. No acute bony abnormalities
are seen.
IMPRESSION: No definite renal or ureteral calcifications are identified by
routine radiography.

## 2013-09-15 ENCOUNTER — Ambulatory Visit: Payer: Self-pay | Admitting: Obstetrics and Gynecology

## 2013-09-15 LAB — COMPREHENSIVE METABOLIC PANEL
Albumin: 4.2 g/dL (ref 3.4–5.0)
Alkaline Phosphatase: 61 U/L
Anion Gap: 3 — ABNORMAL LOW (ref 7–16)
BUN: 12 mg/dL (ref 7–18)
Bilirubin,Total: 0.9 mg/dL (ref 0.2–1.0)
CALCIUM: 8.9 mg/dL (ref 8.5–10.1)
CHLORIDE: 105 mmol/L (ref 98–107)
CO2: 29 mmol/L (ref 21–32)
CREATININE: 0.86 mg/dL (ref 0.60–1.30)
EGFR (Non-African Amer.): >60
Glucose: 97 mg/dL (ref 65–99)
OSMOLALITY: 273 (ref 275–301)
POTASSIUM: 4.6 mmol/L (ref 3.5–5.1)
SGOT(AST): 19 U/L (ref 15–37)
SGPT (ALT): 14 U/L (ref 12–78)
Sodium: 137 mmol/L (ref 136–145)
Total Protein: 7.1 g/dL (ref 6.4–8.2)

## 2013-09-15 LAB — CBC
HCT: 37.2 % (ref 35.0–47.0)
HGB: 12.9 g/dL (ref 12.0–16.0)
MCH: 32.1 pg (ref 26.0–34.0)
MCHC: 34.6 g/dL (ref 32.0–36.0)
MCV: 93 fL (ref 80–100)
Platelet: 189 10*3/uL (ref 150–440)
RBC: 4.02 10*6/uL (ref 3.80–5.20)
RDW: 13 % (ref 11.5–14.5)
WBC: 3.8 10*3/uL (ref 3.6–11.0)

## 2013-09-22 ENCOUNTER — Inpatient Hospital Stay: Payer: Self-pay | Admitting: Obstetrics and Gynecology

## 2013-09-22 LAB — BASIC METABOLIC PANEL
Anion Gap: 8 (ref 7–16)
BUN: 11 mg/dL (ref 7–18)
CALCIUM: 8.3 mg/dL — AB (ref 8.5–10.1)
CHLORIDE: 105 mmol/L (ref 98–107)
CO2: 25 mmol/L (ref 21–32)
Creatinine: 0.69 mg/dL (ref 0.60–1.30)
EGFR (Non-African Amer.): >60
Glucose: 117 mg/dL — ABNORMAL HIGH (ref 65–99)
OSMOLALITY: 276 (ref 275–301)
POTASSIUM: 4 mmol/L (ref 3.5–5.1)
Sodium: 138 mmol/L (ref 136–145)

## 2013-09-22 LAB — MAGNESIUM: MAGNESIUM: 1.8 mg/dL

## 2013-09-22 LAB — CBC
HCT: 30.2 % — AB (ref 35.0–47.0)
HGB: 10.4 g/dL — AB (ref 12.0–16.0)
MCH: 32.1 pg (ref 26.0–34.0)
MCHC: 34.5 g/dL (ref 32.0–36.0)
MCV: 93 fL (ref 80–100)
Platelet: 151 10*3/uL (ref 150–440)
RBC: 3.25 10*6/uL — AB (ref 3.80–5.20)
RDW: 12.7 % (ref 11.5–14.5)
WBC: 9.6 10*3/uL (ref 3.6–11.0)

## 2013-09-23 LAB — CBC
HCT: 28.8 % — AB (ref 35.0–47.0)
HGB: 10 g/dL — AB (ref 12.0–16.0)
MCH: 32.5 pg (ref 26.0–34.0)
MCHC: 34.8 g/dL (ref 32.0–36.0)
MCV: 93 fL (ref 80–100)
Platelet: 138 10*3/uL — ABNORMAL LOW (ref 150–440)
RBC: 3.08 10*6/uL — ABNORMAL LOW (ref 3.80–5.20)
RDW: 13.6 % (ref 11.5–14.5)
WBC: 4.7 10*3/uL (ref 3.6–11.0)

## 2013-09-23 LAB — BASIC METABOLIC PANEL
Anion Gap: 4 — ABNORMAL LOW (ref 7–16)
BUN: 13 mg/dL (ref 7–18)
CALCIUM: 8.5 mg/dL (ref 8.5–10.1)
Chloride: 105 mmol/L (ref 98–107)
Co2: 29 mmol/L (ref 21–32)
Creatinine: 0.83 mg/dL (ref 0.60–1.30)
EGFR (African American): >60
Glucose: 131 mg/dL — ABNORMAL HIGH (ref 65–99)
Osmolality: 278 (ref 275–301)
Potassium: 3.9 mmol/L (ref 3.5–5.1)
Sodium: 138 mmol/L (ref 136–145)

## 2013-09-23 LAB — MAGNESIUM: MAGNESIUM: 1.7 mg/dL — AB

## 2013-09-25 LAB — PATHOLOGY REPORT

## 2013-12-03 ENCOUNTER — Ambulatory Visit: Payer: Commercial Managed Care - PPO | Admitting: Family Medicine

## 2013-12-03 ENCOUNTER — Ambulatory Visit (INDEPENDENT_AMBULATORY_CARE_PROVIDER_SITE_OTHER): Payer: Commercial Managed Care - PPO | Admitting: Internal Medicine

## 2013-12-03 ENCOUNTER — Encounter: Payer: Self-pay | Admitting: Internal Medicine

## 2013-12-03 VITALS — BP 98/62 | HR 66 | Temp 98.2°F | Ht 63.5 in | Wt 140.8 lb

## 2013-12-03 DIAGNOSIS — N3000 Acute cystitis without hematuria: Secondary | ICD-10-CM

## 2013-12-03 DIAGNOSIS — R300 Dysuria: Secondary | ICD-10-CM

## 2013-12-03 LAB — POCT URINALYSIS DIPSTICK
BILIRUBIN UA: NEGATIVE
Blood, UA: NEGATIVE
GLUCOSE UA: NEGATIVE
Ketones, UA: NEGATIVE
Leukocytes, UA: NEGATIVE
Nitrite, UA: NEGATIVE
Protein, UA: NEGATIVE
Urobilinogen, UA: 0.2
pH, UA: 6

## 2013-12-03 MED ORDER — CIPROFLOXACIN HCL 500 MG PO TABS: 500.0000 mg | ORAL_TABLET | Freq: Two times a day (BID) | ORAL | Status: DC

## 2013-12-03 NOTE — Progress Notes (Signed)
Pre visit review using our clinic review tool, if applicable. No additional management support is needed unless otherwise documented below in the visit note. 

## 2013-12-03 NOTE — Assessment & Plan Note (Signed)
Symptoms are consistent with UTI, however UA normal. Will send urine for culture. Will start Cipro bid. Increase fluid intake. Follow up if symptoms are not improving.

## 2013-12-03 NOTE — Patient Instructions (Signed)
Start Cipro.   Increase fluid intake.  We will call you with the urine culture results.

## 2013-12-03 NOTE — Progress Notes (Signed)
   Subjective:    Patient ID: Lindsay Collins, female    DOB: 1985/10/31, 28 y.o.   MRN: [id number]  HPI 27YO female presents for acute visit.  Started having burning with urination about 2 days ago. Mild urgency. No fever, chills, flank pain. She has not taken any medication for this.  Review of Systems  Constitutional: Negative for fever, chills and fatigue.  Gastrointestinal: Negative for nausea, vomiting, abdominal pain, diarrhea, constipation and rectal pain.  Genitourinary: Positive for dysuria and urgency. Negative for frequency, hematuria, flank pain, decreased urine volume, vaginal bleeding, vaginal discharge, difficulty urinating, vaginal pain and pelvic pain.       Objective:    BP 98/62  Pulse 66  Temp(Src) 98.2 F (36.8 C) (Oral)  Ht 5' 3.5" (1.613 m)  Wt 140 lb 12 oz (63.844 kg)  BMI 24.54 kg/m2  SpO2 98% Physical Exam  Constitutional: She is oriented to person, place, and time. She appears well-developed and well-nourished. No distress.  HENT:  Head: Normocephalic and atraumatic.  Right Ear: External ear normal.  Left Ear: External ear normal.  Nose: Nose normal.  Mouth/Throat: Oropharynx is clear and moist.  Eyes: Conjunctivae are normal. Pupils are equal, round, and reactive to light. Right eye exhibits no discharge. Left eye exhibits no discharge. No scleral icterus.  Neck: Normal range of motion. Neck supple. No tracheal deviation present. No thyromegaly present.  Cardiovascular: Normal rate, regular rhythm, normal heart sounds and intact distal pulses.  Exam reveals no gallop and no friction rub.   No murmur heard. Pulmonary/Chest: Effort normal and breath sounds normal. No accessory muscle usage. Not tachypneic. No respiratory distress. She has no decreased breath sounds. She has no wheezes. She has no rhonchi. She has no rales. She exhibits no tenderness.  Abdominal: There is no tenderness (no CVA tenderness).  Musculoskeletal: Normal range of motion. She  exhibits no edema and no tenderness.  Lymphadenopathy:    She has no cervical adenopathy.  Neurological: She is alert and oriented to person, place, and time. No cranial nerve deficit. She exhibits normal muscle tone. Coordination normal.  Skin: Skin is warm and dry. No rash noted. She is not diaphoretic. No erythema. No pallor.  Psychiatric: She has a normal mood and affect. Her behavior is normal. Judgment and thought content normal.          Assessment & Plan:   Problem List Items Addressed This Visit     Unprioritized   Acute cystitis without hematuria - Primary     Symptoms are consistent with UTI, however UA normal. Will send urine for culture. Will start Cipro bid. Increase fluid intake. Follow up if symptoms are not improving.    Relevant Medications      ciprofloxacin (CIPRO) tablet   Other Relevant Orders      CULTURE, URINE COMPREHENSIVE    Other Visit Diagnoses   Dysuria        Relevant Orders       POCT Urinalysis Dipstick (Completed)        Return if symptoms worsen or fail to improve.

## 2013-12-05 LAB — CULTURE, URINE COMPREHENSIVE

## 2013-12-08 ENCOUNTER — Telehealth: Payer: Self-pay | Admitting: *Deleted

## 2013-12-08 NOTE — Telephone Encounter (Signed)
After notifying pt of results this morning, she called back to Dr Dan Humphreys know that she has had some discharge, Asked pt if that was a new symptom or if she discussed this with Dr Dan Humphreys during her visit, pt stated that she did have some discharge but did not discuss that with Dr Dan Humphreys, Pt said that she would contact Dr Dayton Martes about results

## 2013-12-10 ENCOUNTER — Telehealth: Payer: Self-pay | Admitting: Internal Medicine

## 2013-12-10 ENCOUNTER — Other Ambulatory Visit: Payer: Self-pay | Admitting: Internal Medicine

## 2013-12-10 MED ORDER — SULFAMETHOXAZOLE-TMP DS 800-160 MG PO TABS: 1.0000 | ORAL_TABLET | Freq: Two times a day (BID) | ORAL | Status: DC

## 2013-12-10 NOTE — Telephone Encounter (Signed)
Pt is aware as instructed 

## 2013-12-10 NOTE — Telephone Encounter (Signed)
Pt states she spoke with you at her job about her uti that Dr. Dayton Martes treated. Cipro is not helping her UTI and would like something else called in for her. Please advise.  [facility] [place]

## 2013-12-10 NOTE — Telephone Encounter (Signed)
Called in septra for her to try

## 2014-01-21 ENCOUNTER — Telehealth: Payer: Self-pay | Admitting: Family Medicine

## 2014-01-21 NOTE — Telephone Encounter (Signed)
i advised pt to make an appt to be evaluated as she has not been seen for this--i advised pt there were no openings for today and advised to go to UC, pt has an appt with Dr Dayton MartesAron 01/24/2014 for something else--pt hung the phone on me as I was going over options

## 2014-01-21 NOTE — Telephone Encounter (Signed)
Pt called requesting z-pak. She states she has a sinus infection. I informed her we typically do not treat with abx without an appt. Please advise.   [facility] [place]

## 2014-01-24 ENCOUNTER — Encounter: Payer: Self-pay | Admitting: Family Medicine

## 2014-01-24 ENCOUNTER — Ambulatory Visit (INDEPENDENT_AMBULATORY_CARE_PROVIDER_SITE_OTHER): Payer: Commercial Managed Care - PPO | Admitting: Family Medicine

## 2014-01-24 VITALS — BP 106/66 | HR 62 | Temp 98.1°F | Wt 138.0 lb

## 2014-01-24 DIAGNOSIS — N644 Mastodynia: Secondary | ICD-10-CM

## 2014-01-24 NOTE — Assessment & Plan Note (Signed)
New- ? If related to what would have been her menstrual cycle (does still have her ovaries). I did strongly advise getting imaging- breast ultrasound, bilateral but she declined (AMA). She did say she would call me back in the next few days if pain persisted and would agree to imaging at that time.

## 2014-01-24 NOTE — Patient Instructions (Signed)
Good to see you. I do recommend that we do an ultrasound of your breasts.  If you are still having pain by the end of this week, please call me.

## 2014-01-24 NOTE — Progress Notes (Signed)
Pre visit review using our clinic review tool, if applicable. No additional management support is needed unless otherwise documented below in the visit note. 

## 2014-01-24 NOTE — Progress Notes (Signed)
   Subjective:    Patient ID: Lindsay ButteryAlicia J Collins, female    DOB: 1986/01/23, 28 y.o.   MRN: [id number]  HPI  Very pleasant 28 yo female here for 4 days of bilateral breast "soreness."  S/p completed hysterectomy (ovaries remain intact) in 08/2013 for persistently abnormal pap smears/colposcopy- uterine path neg.  Prior to getting hysterectomy, had not had a period in years because she had been receiving IM depo q 3 months.  She is not sure if her breasts were tender when she did menstruate.  Not sure if her mom had breast CA- died of COPD at young age. No other know FH of breast CA.  She has not noticed any masses or nipple discharge. No current outpatient prescriptions on file prior to visit.   No current facility-administered medications on file prior to visit.    No Known Allergies  Past Medical History  Diagnosis Date  . Nephrolithiasis     Past Surgical History  Procedure Laterality Date  . Abdominal hysterectomy  2015    abnormal PAPs    Family History  Problem Relation Age of Onset  . COPD Mother   . COPD Father   . Cancer Father     prostate    History   Social History  . Marital Status: Single    Spouse Name: N/A    Number of Children: N/A  . Years of Education: N/A   Occupational History  . Not on file.   Social History Main Topics  . Smoking status: Never Smoker   . Smokeless tobacco: Not on file  . Alcohol Use: No  . Drug Use: No  . Sexual Activity: Yes   Other Topics Concern  . Not on file   Social History Narrative  . No narrative on file   The PMH, PSH, Social History, Family History, Medications, and allergies [facility], and have been updated if relevant.    Review of Systems  Constitutional: Negative.   HENT: Negative.   Respiratory: Negative.   Genitourinary: Negative for vaginal pain.  All other systems reviewed and are negative.      Objective:   Physical Exam  Nursing note and vitals  reviewed. Constitutional: She appears well-developed and well-nourished. No distress.  HENT:  Head: Normocephalic.  Pulmonary/Chest: Right breast exhibits no inverted nipple, no mass, no nipple discharge, no skin change and no tenderness. Left breast exhibits no inverted nipple, no mass, no nipple discharge, no skin change and no tenderness. Breasts are symmetrical.  Lymphadenopathy:    She has no axillary adenopathy.  Psychiatric: She has a normal mood and affect. Her speech is normal and behavior is normal. Judgment and thought content normal. Cognition and memory are normal.    BP 106/66  Pulse 62  Temp(Src) 98.1 F (36.7 C) (Oral)  Wt 138 lb (62.596 kg)  SpO2 97%       Assessment & Plan:

## 2014-02-14 ENCOUNTER — Encounter: Payer: Self-pay | Admitting: Internal Medicine

## 2014-02-14 ENCOUNTER — Ambulatory Visit (INDEPENDENT_AMBULATORY_CARE_PROVIDER_SITE_OTHER): Payer: Commercial Managed Care - PPO | Admitting: Internal Medicine

## 2014-02-14 VITALS — BP 106/62 | HR 69 | Temp 98.4°F | Wt 140.0 lb

## 2014-02-14 DIAGNOSIS — N898 Other specified noninflammatory disorders of vagina: Secondary | ICD-10-CM

## 2014-02-14 NOTE — Progress Notes (Signed)
Pre visit review using our clinic review tool, if applicable. No additional management support is needed unless otherwise documented below in the visit note. 

## 2014-02-14 NOTE — Patient Instructions (Signed)

## 2014-02-14 NOTE — Progress Notes (Signed)
Subjective:    Patient ID: Lindsay ButteryAlicia J Amyx, female    DOB: 1986-02-05, 28 y.o.   MRN: [id number]  HPI  Pt presents to the clinic today with c/o vaginal discharge and itching. She reports this started yesterday. The discharge is thin and white. She had had some associated bladder pressure but denies dysuria. She denies any vaginal odor or abnormal bleeding. She has had a hysterectomy. She does report having a new sexual partner. She did not use protection.  Review of Systems      Past Medical History  Diagnosis Date  . Nephrolithiasis     No current outpatient prescriptions on file.   No current facility-administered medications for this visit.    No Known Allergies  Family History  Problem Relation Age of Onset  . COPD Mother   . COPD Father   . Cancer Father     prostate    History   Social History  . Marital Status: Single    Spouse Name: N/A    Number of Children: N/A  . Years of Education: N/A   Occupational History  . Not on file.   Social History Main Topics  . Smoking status: Never Smoker   . Smokeless tobacco: Not on file  . Alcohol Use: No  . Drug Use: No  . Sexual Activity: Yes   Other Topics Concern  . Not on file   Social History Narrative     Constitutional: Denies fever, malaise, fatigue, headache or abrupt weight changes.  Gastrointestinal: Denies abdominal pain, bloating, constipation, diarrhea or blood in the stool.  GU: Pt reports vaginal discharge. Denies urgency, frequency, pain with urination, burning sensation, blood in urine, odor.   No other specific complaints in a complete review of systems (except as listed in HPI above).  Objective:   Physical Exam  BP 106/62 mmHg  Pulse 69  Temp(Src) 98.4 F (36.9 C) (Oral)  Wt 140 lb (63.504 kg)  SpO2 98% Wt Readings from Last 3 Encounters:  02/14/14 140 lb (63.504 kg)  01/24/14 138 lb (62.596 kg)  12/03/13 140 lb 12 oz (63.844 kg)    General: Appears her stated age, well  developed, well nourished in NAD. Cardiovascular: Normal rate and rhythm. S1,S2 noted.  No murmur, rubs or gallops noted.  Pulmonary/Chest: Normal effort and positive vesicular breath sounds. No respiratory distress. No wheezes, rales or ronchi noted.  Abdomen: Soft and nontender. Normal bowel sounds, no bruits noted. No distention or masses noted. Liver, spleen and kidneys non palpable. Pelvic: Normal female anatomy. Small amount of thin white discharge noted. Slight labial irritation noted.  BMET    Component Value Date/Time   NA 140 06/11/2013 0939   K 4.3 06/11/2013 0939   CL 107 06/11/2013 0939   CO2 26 06/11/2013 0939   GLUCOSE 95 06/11/2013 0939   BUN 16 06/11/2013 0939   CREATININE 1.0 06/11/2013 0939   CALCIUM 9.6 06/11/2013 0939    Lipid Panel     Component Value Date/Time   CHOL 147 06/11/2013 0939   TRIG 36.0 06/11/2013 0939   HDL 56.30 06/11/2013 0939   CHOLHDL 3 06/11/2013 0939   VLDL 7.2 06/11/2013 0939   LDLCALC 84 06/11/2013 0939    CBC    Component Value Date/Time   WBC 4.0* 06/11/2013 0939   RBC 4.17 06/11/2013 0939   HGB 13.2 06/11/2013 0939   HCT 38.9 06/11/2013 0939   PLT 230.0 06/11/2013 0939   MCV 93.1 06/11/2013 0939  MCHC 33.9 06/11/2013 0939   RDW 12.9 06/11/2013 0939   LYMPHSABS 1.4 06/11/2013 0939   MONOABS 0.3 06/11/2013 0939   EOSABS 0.0 06/11/2013 0939   BASOSABS 0.0 06/11/2013 0939    Hgb A1C No results found for: HGBA1C       Assessment & Plan:   Vaginal discharge with irritation:  Wet prep: neg for yeast, trich or clue cells Urinalysis normal Will check urine g/c Discussed safe sex habits  Will follow up after labs are back- RTC as needed or if symptoms persist or worsen

## 2014-02-15 LAB — POCT URINALYSIS DIPSTICK
Blood, UA: NEGATIVE
Glucose, UA: NEGATIVE
KETONES UA: NEGATIVE
Leukocytes, UA: NEGATIVE
Nitrite, UA: NEGATIVE
Urobilinogen, UA: 0.2
pH, UA: 6

## 2014-02-15 NOTE — Addendum Note (Signed)
Addended by: Roena MaladyEVONTENNO, Cornelius Schuitema Y on: 02/15/2014 09:58 AM   Modules accepted: Orders

## 2014-02-16 ENCOUNTER — Telehealth: Payer: Self-pay | Admitting: Radiology

## 2014-02-16 ENCOUNTER — Telehealth: Payer: Self-pay | Admitting: Internal Medicine

## 2014-02-16 ENCOUNTER — Other Ambulatory Visit: Payer: Self-pay | Admitting: Internal Medicine

## 2014-02-16 DIAGNOSIS — Z113 Encounter for screening for infections with a predominantly sexual mode of transmission: Secondary | ICD-10-CM

## 2014-02-16 NOTE — Telephone Encounter (Signed)
Opened in error

## 2014-02-16 NOTE — Addendum Note (Signed)
Addended by: Alvina ChouWALSH, Saray Capasso J on: 02/16/2014 03:16 PM   Modules accepted: Orders

## 2014-02-16 NOTE — Telephone Encounter (Signed)
Pt calling in and would like for you to call her back. Says she has some questions, would not state what the questions were concerning. Thank you!

## 2014-02-17 ENCOUNTER — Other Ambulatory Visit: Payer: Commercial Managed Care - PPO

## 2014-02-17 NOTE — Telephone Encounter (Signed)
Called pt no answer °

## 2014-02-28 ENCOUNTER — Telehealth: Payer: Self-pay | Admitting: Family Medicine

## 2014-02-28 NOTE — Telephone Encounter (Signed)
Pt is requesting a call back [facility] but would not tell me any info on what she needs to speak about. She would not even tell me if it was a physical ailment or otherwise.

## 2014-02-28 NOTE — Telephone Encounter (Signed)
Left message on voicemail.

## 2014-02-28 NOTE — Telephone Encounter (Signed)
Nicki ReaperRegina Baity spoke to pt

## 2014-03-16 ENCOUNTER — Encounter: Payer: Self-pay | Admitting: Family Medicine

## 2014-03-23 ENCOUNTER — Telehealth: Payer: Self-pay | Admitting: Family Medicine

## 2014-03-23 NOTE — Telephone Encounter (Signed)
Pt calling in wanting to speak w/ Marcelline MatesWaynetta or Dr Dayton MartesAron about a possible UTI. Pt is having severe burning and itching, with discharge. Please advise. Thank you!

## 2014-03-23 NOTE — Telephone Encounter (Signed)
Please call to check on pt.  I can see her tomorrow am!

## 2014-03-23 NOTE — Telephone Encounter (Signed)
Spoke to pt and offered appt for tomorrow. Pt refused appt stating that she is unable to leave work early due to the holiday. I advised pt if pain becomes unbearable before Monday, to go to the ED or UC. Pt refused that as well, stating that her copay is too high. Advised pt to contact office should she change her mind and want to be seen tomorrow; pt verbally expressed understanding.

## 2014-04-29 ENCOUNTER — Encounter: Payer: Self-pay | Admitting: Internal Medicine

## 2014-04-29 ENCOUNTER — Ambulatory Visit (INDEPENDENT_AMBULATORY_CARE_PROVIDER_SITE_OTHER): Payer: Commercial Managed Care - PPO | Admitting: Internal Medicine

## 2014-04-29 VITALS — BP 108/80 | HR 74 | Temp 98.2°F | Wt 143.5 lb

## 2014-04-29 DIAGNOSIS — B3731 Acute candidiasis of vulva and vagina: Secondary | ICD-10-CM

## 2014-04-29 DIAGNOSIS — Z202 Contact with and (suspected) exposure to infections with a predominantly sexual mode of transmission: Secondary | ICD-10-CM

## 2014-04-29 DIAGNOSIS — B373 Candidiasis of vulva and vagina: Secondary | ICD-10-CM

## 2014-04-29 MED ORDER — CEFTRIAXONE SODIUM 1 G IJ SOLR
1.0000 g | Freq: Once | INTRAMUSCULAR | Status: AC
  Administered 2014-04-29: 1 g via INTRAMUSCULAR

## 2014-04-29 MED ORDER — FLUCONAZOLE 150 MG PO TABS: 150.0000 mg | ORAL_TABLET | Freq: Once | ORAL | Status: DC

## 2014-04-29 MED ORDER — AZITHROMYCIN 500 MG PO TABS: 1000.0000 mg | ORAL_TABLET | Freq: Every day | ORAL | Status: DC

## 2014-04-29 NOTE — Progress Notes (Signed)
Subjective:    Patient ID: Lindsay Collins, female    DOB: [birth year], 29 y.o.   MRN: [id number]  HPI  Pt presents to the clinic today with c/o vaginal discharge, itching and exposure to gonorrhea and chlamydia. She reports her ex boyfriend showed her a paper from the health department this week that he had been treated for gonorrhea and chlamydia. The health department recommend that she also receive testing and treatment, even though they have not been sexually active recently. She started having a thick white vaginal discharge on Monday with vaginal itching. She denies odor or abnormal bleeding. She did try Monistat OTC with some relief. She is not currently sexually active. She has had a hysterectomy.  Review of Systems      Past Medical History  Diagnosis Date  . Nephrolithiasis     No current outpatient prescriptions on file.   No current facility-administered medications for this visit.    No Known Allergies  Family History  Problem Relation Age of Onset  . COPD Mother   . COPD Father   . Cancer Father     prostate    History   Social History  . Marital Status: Single    Spouse Name: N/A    Number of Children: N/A  . Years of Education: N/A   Occupational History  . Not on file.   Social History Main Topics  . Smoking status: Never Smoker   . Smokeless tobacco: Not on file  . Alcohol Use: No  . Drug Use: No  . Sexual Activity: Yes   Other Topics Concern  . Not on file   Social History Narrative     Constitutional: Denies fever, malaise, fatigue, headache or abrupt weight changes.  Respiratory: Denies difficulty breathing, shortness of breath, cough or sputum production.   Cardiovascular: Denies chest pain, chest tightness, palpitations or swelling in the hands or feet.  Gastrointestinal: Denies abdominal pain, bloating, constipation, diarrhea or blood in the stool.  GU: Pt reports vaginal discharge and itching. Denies urgency, frequency, pain with  urination, burning sensation, blood in urine, odor. Skin: Denies redness, rashes, lesions or ulcercations.    No other specific complaints in a complete review of systems (except as listed in HPI above).  Objective:   Physical Exam   BP 108/80 mmHg  Pulse 74  Temp(Src) 98.2 F (36.8 C) (Oral)  Wt 143 lb 8 oz (65.091 kg)  SpO2 99% Wt Readings from Last 3 Encounters:  04/29/14 143 lb 8 oz (65.091 kg)  02/14/14 140 lb (63.504 kg)  01/24/14 138 lb (62.596 kg)    General: Appears her stated age, well developed, well nourished in NAD. Skin: Warm, dry and intact. No rashes, lesions or ulcerations noted. Cardiovascular: Normal rate and rhythm. S1,S2 noted.  No murmur, rubs or gallops noted. Pulmonary/Chest: Normal effort and positive vesicular breath sounds. No respiratory distress. No wheezes, rales or ronchi noted.  Abdomen: Soft and nontender. Normal bowel sounds, no bruits noted. No distention or masses noted. Liver, spleen and kidneys non palpable. No CVA tenderness. Pelvic: Normal female anatomy. Large amount of thick, white discharge noted at vaginal opening. No odor.  BMET    Component Value Date/Time   NA 140 06/11/2013 0939   K 4.3 06/11/2013 0939   CL 107 06/11/2013 0939   CO2 26 06/11/2013 0939   GLUCOSE 95 06/11/2013 0939   BUN 16 06/11/2013 0939   CREATININE 1.0 06/11/2013 0939   CALCIUM 9.6 06/11/2013 0939  Lipid Panel     Component Value Date/Time   CHOL 147 06/11/2013 0939   TRIG 36.0 06/11/2013 0939   HDL 56.30 06/11/2013 0939   CHOLHDL 3 06/11/2013 0939   VLDL 7.2 06/11/2013 0939   LDLCALC 84 06/11/2013 0939    CBC    Component Value Date/Time   WBC 4.0* 06/11/2013 0939   RBC 4.17 06/11/2013 0939   HGB 13.2 06/11/2013 0939   HCT 38.9 06/11/2013 0939   PLT 230.0 06/11/2013 0939   MCV 93.1 06/11/2013 0939   MCHC 33.9 06/11/2013 0939   RDW 12.9 06/11/2013 0939   LYMPHSABS 1.4 06/11/2013 0939   MONOABS 0.3 06/11/2013 0939   EOSABS 0.0  06/11/2013 0939   BASOSABS 0.0 06/11/2013 0939    Hgb A1C No results found for: HGBA1C      Assessment & Plan:   Exposure to STD:  Will do vaginal swab (unable to be endocervical d/t hysterectomy) for G/C 1 gm Rocephin IM now 1 gm Azithromax PO x 1- RX given Discussed safe sexual practices  Vaginal discharge and itching secondary to candida vaginitis:  Wet prep- large yeast, no clue, no trich eRx for Diflucan 150 mg PO x 1, may repeat in 3 days if needed Stop Monistat  RTC as needed or if symptoms persist or worsen

## 2014-04-29 NOTE — Patient Instructions (Signed)

## 2014-04-30 LAB — GC/CHLAMYDIA PROBE AMP
CT Probe RNA: NEGATIVE
GC PROBE AMP APTIMA: NEGATIVE

## 2014-05-02 ENCOUNTER — Telehealth: Payer: Self-pay | Admitting: Family Medicine

## 2014-05-02 NOTE — Telephone Encounter (Signed)
Ok with me 

## 2014-05-02 NOTE — Telephone Encounter (Signed)
Fine with me

## 2014-05-02 NOTE — Telephone Encounter (Signed)
Left message asking pt to call office  °

## 2014-05-02 NOTE — Telephone Encounter (Signed)
Pt aware appointmetn 2/3

## 2014-05-02 NOTE — Telephone Encounter (Signed)
Pt wants to switch from dr Dayton Martesaron to [employer] [facility].  Due to availability of appointment

## 2014-05-03 ENCOUNTER — Other Ambulatory Visit: Payer: Self-pay

## 2014-05-03 ENCOUNTER — Other Ambulatory Visit: Payer: Self-pay | Admitting: Internal Medicine

## 2014-05-03 ENCOUNTER — Encounter: Payer: Self-pay | Admitting: Internal Medicine

## 2014-05-03 MED ORDER — FLUOXETINE HCL 20 MG PO TABS: 20.0000 mg | ORAL_TABLET | Freq: Every day | ORAL | Status: DC

## 2014-05-03 MED ORDER — ALPRAZOLAM 0.5 MG PO TABS: 0.5000 mg | ORAL_TABLET | Freq: Two times a day (BID) | ORAL | Status: DC | PRN

## 2014-05-03 NOTE — Telephone Encounter (Signed)
Xanax called into pharmacy.

## 2014-05-04 ENCOUNTER — Ambulatory Visit: Payer: Commercial Managed Care - PPO | Admitting: Internal Medicine

## 2014-05-20 IMAGING — US TRANSABDOMINAL ULTRASOUND OF PELVIS
1 series · 14 of 25 positions shown · non-contrast
Comparison: none

REASON FOR EXAM: LLQ abd pain
COMMENTS:

[Series 1: transabdominal ultrasound of pelvis · 0.31mm/px · 14 of 81 slices shown]
[im 1/81]
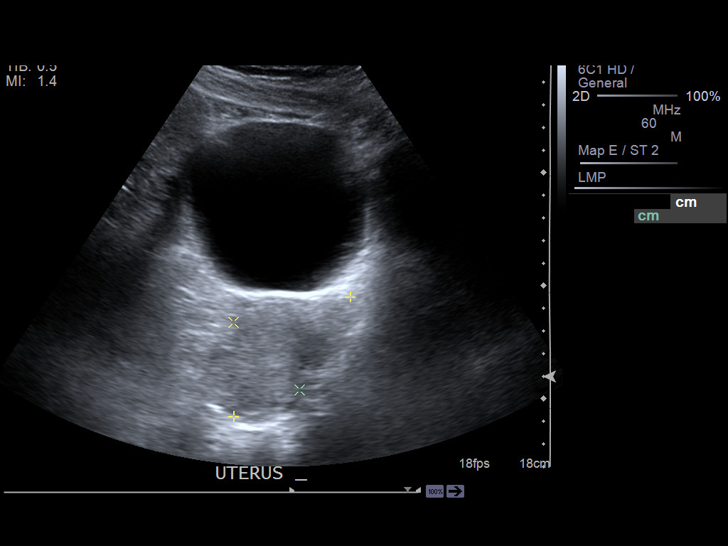
[im 7/81]
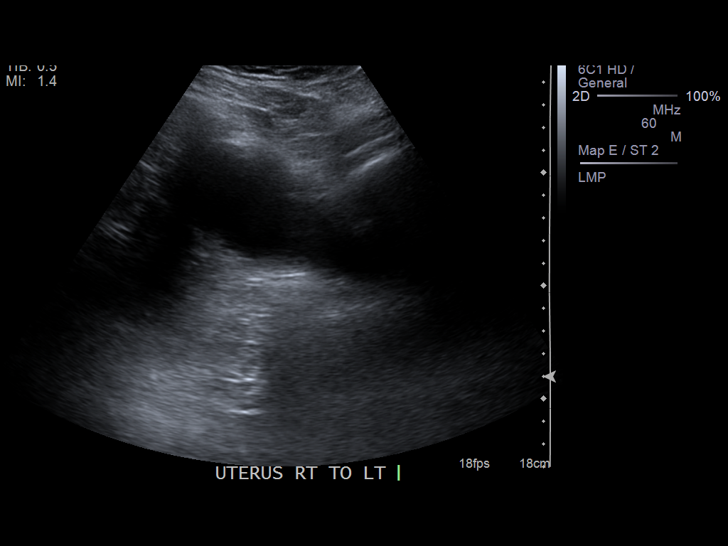
[im 14/81]
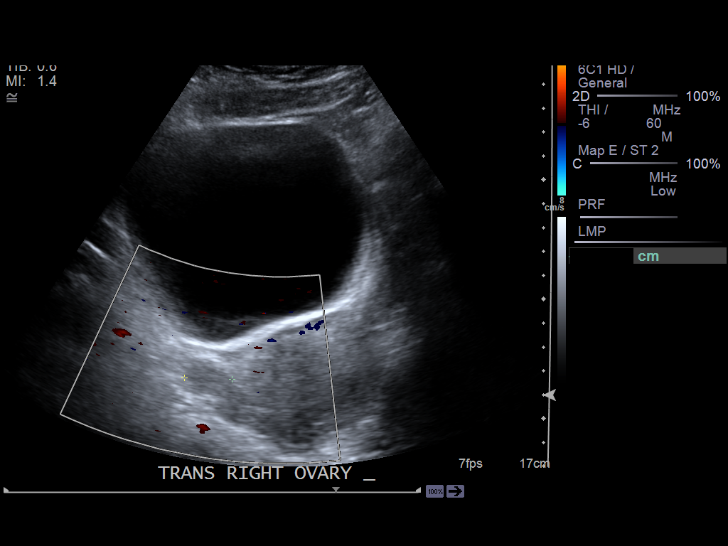
[im 21/81]
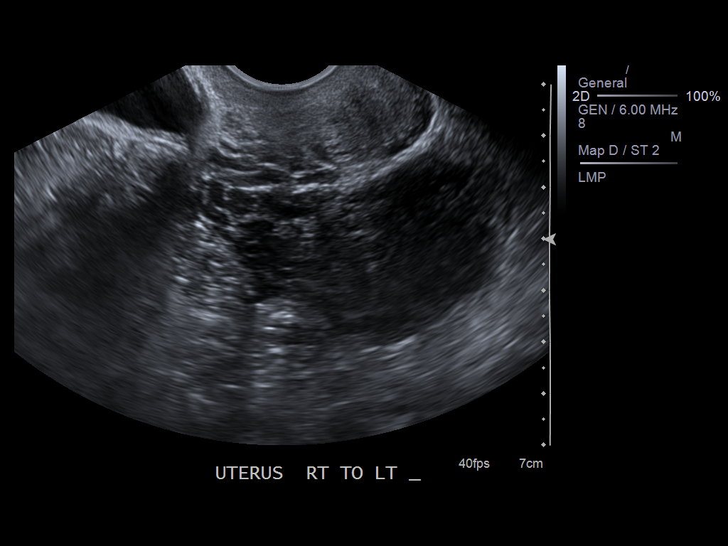
[im 27/81]
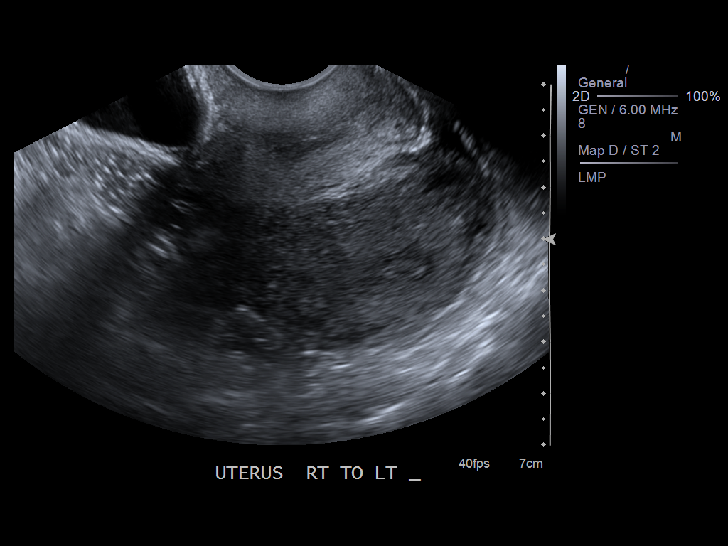
[im 31/81]
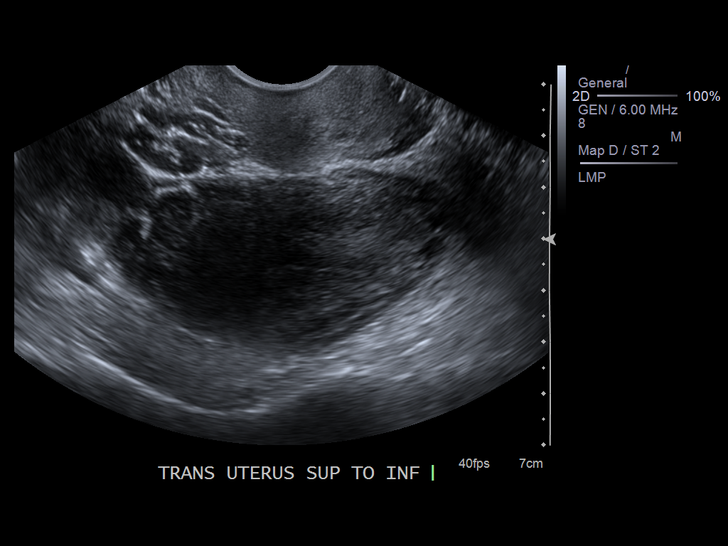
[im 37/81]
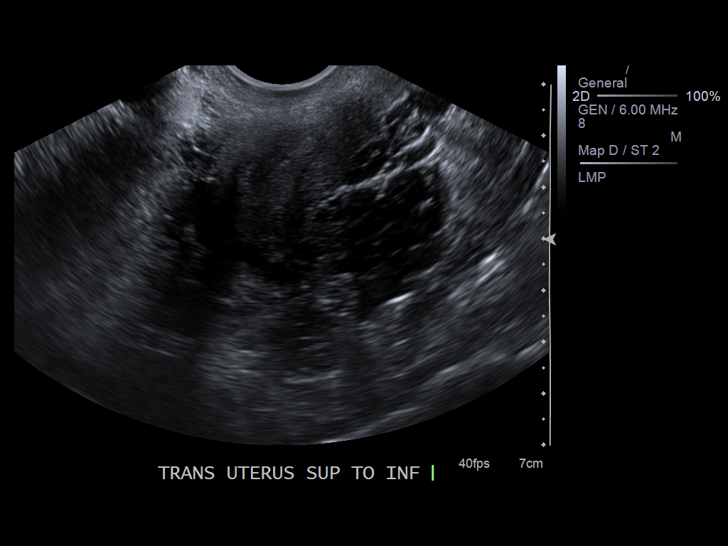
[im 44/81]
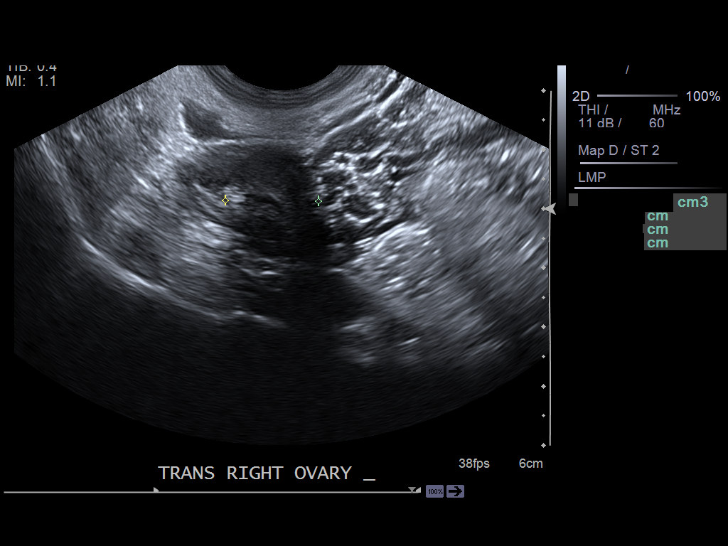
[im 51/81]
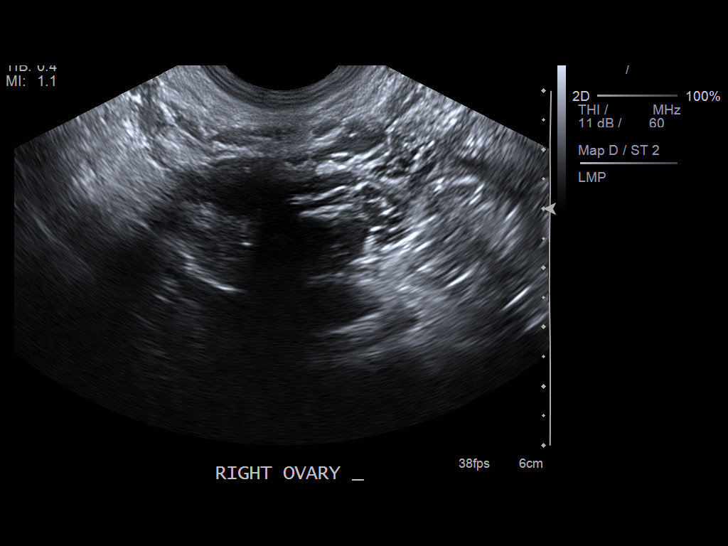
[im 54/81]
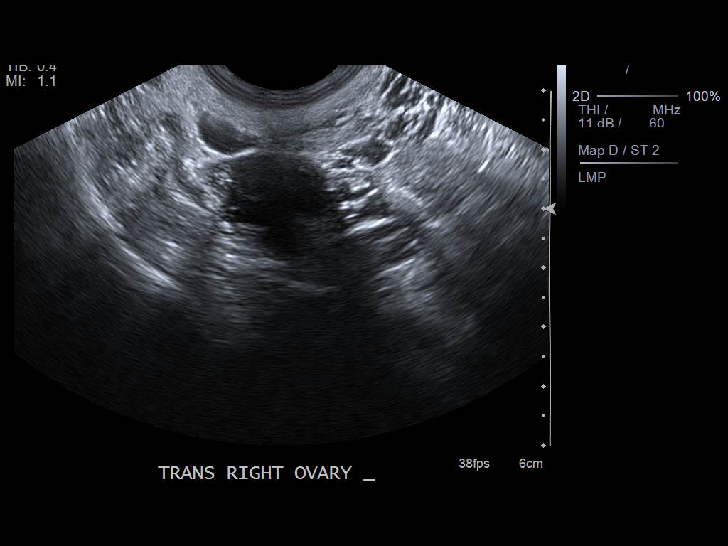
[im 61/81]
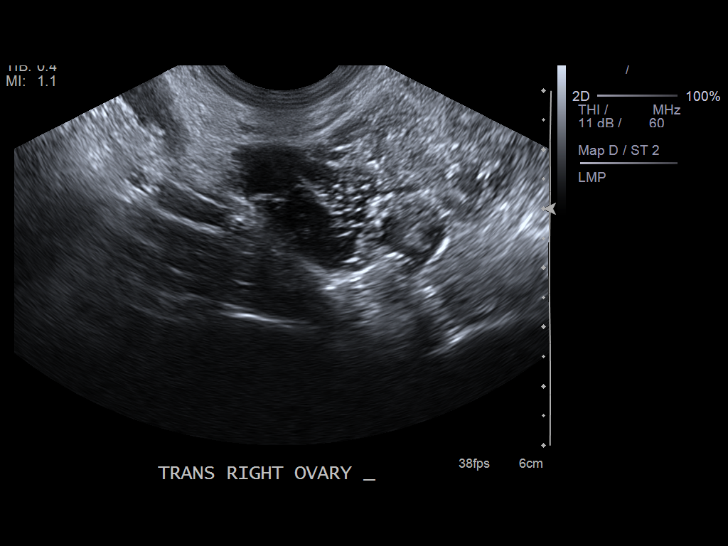
[im 67/81]
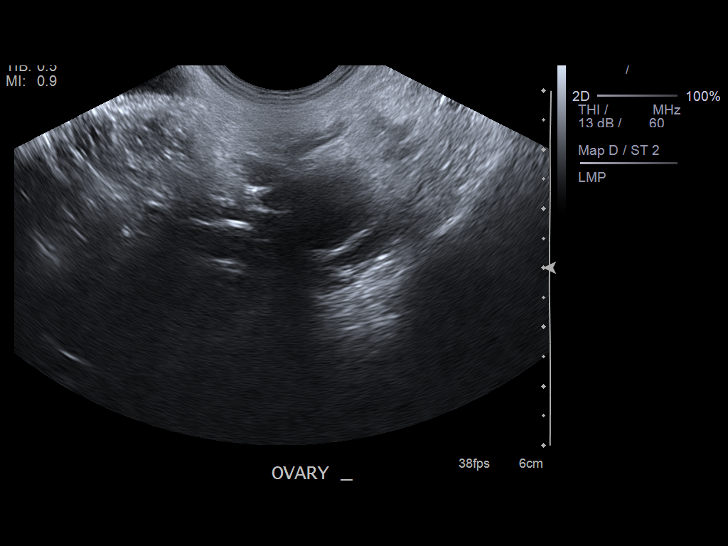
[im 74/81]
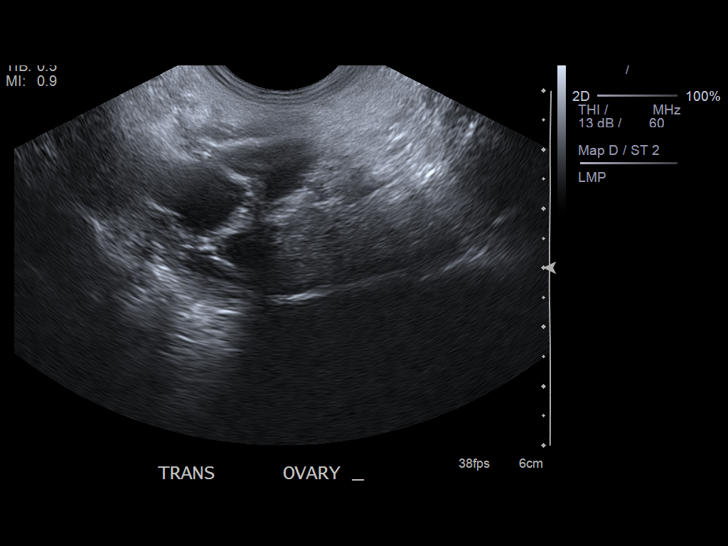
[im 81/81]
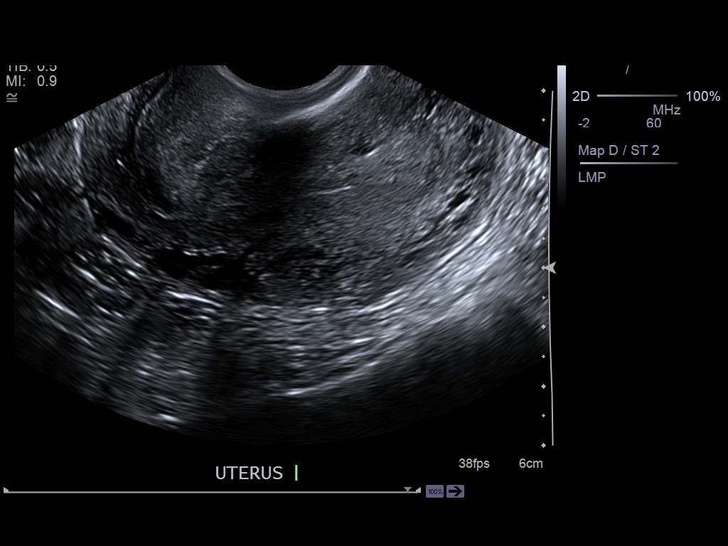

[14 of 25 positions shown; findings below may reference images not displayed]

PROCEDURE:     DOGHATI - DOGHATI PELVIS NON-OB W/TRANSVAGINAL  - November 04, 2011  [DATE]

RESULT:     Pelvic sonogram demonstrates the uterus measures 7.38 x 4.18 x
5.51 cm with an endometrial thickness measuring 6.7 mm. Both ovaries are
seen and appear to be grossly normal. Color Dopplers used to document blood
flow in both ovaries. The left ovary measures 3.03 x 2.02 x 1.74 cm. The
right ovary measures 3.7 x 2.46 I 1.57 cm. The kidneys appear grossly normal
without obstruction.
IMPRESSION: Normal appearing pelvic sonogram. Small follicles are noted
within the ovaries. Blood flow is restricted to both ovaries.

[REDACTED]

## 2014-06-08 ENCOUNTER — Other Ambulatory Visit: Payer: Self-pay | Admitting: Internal Medicine

## 2014-06-08 DIAGNOSIS — Z0000 Encounter for general adult medical examination without abnormal findings: Secondary | ICD-10-CM

## 2014-06-09 ENCOUNTER — Other Ambulatory Visit: Payer: Self-pay | Admitting: Internal Medicine

## 2014-06-09 MED ORDER — PERMETHRIN 5 % EX CREA: 1.0000 "application " | TOPICAL_CREAM | Freq: Once | CUTANEOUS | Status: DC

## 2014-06-14 ENCOUNTER — Other Ambulatory Visit: Payer: Commercial Managed Care - PPO

## 2014-06-15 ENCOUNTER — Other Ambulatory Visit: Payer: Self-pay | Admitting: Internal Medicine

## 2014-06-15 MED ORDER — SERTRALINE HCL 25 MG PO TABS: 25.0000 mg | ORAL_TABLET | Freq: Every day | ORAL | Status: DC

## 2014-06-20 ENCOUNTER — Ambulatory Visit (INDEPENDENT_AMBULATORY_CARE_PROVIDER_SITE_OTHER): Payer: Commercial Managed Care - PPO | Admitting: Internal Medicine

## 2014-06-20 ENCOUNTER — Encounter: Payer: Self-pay | Admitting: Internal Medicine

## 2014-06-20 VITALS — BP 106/68 | HR 69 | Temp 98.1°F | Ht 64.0 in | Wt 141.0 lb

## 2014-06-20 DIAGNOSIS — Z113 Encounter for screening for infections with a predominantly sexual mode of transmission: Secondary | ICD-10-CM | POA: Diagnosis not present

## 2014-06-20 DIAGNOSIS — Z0000 Encounter for general adult medical examination without abnormal findings: Secondary | ICD-10-CM | POA: Diagnosis not present

## 2014-06-20 LAB — COMPREHENSIVE METABOLIC PANEL
ALK PHOS: 53 U/L (ref 39–117)
ALT: 13 U/L (ref 0–35)
AST: 18 U/L (ref 0–37)
Albumin: 4.4 g/dL (ref 3.5–5.2)
BUN: 16 mg/dL (ref 6–23)
CO2: 31 mEq/L (ref 19–32)
CREATININE: 1.12 mg/dL (ref 0.40–1.20)
Calcium: 9.2 mg/dL (ref 8.4–10.5)
Chloride: 104 mEq/L (ref 96–112)
GFR: 61.03 mL/min (ref >60.00)
Glucose, Bld: 100 mg/dL — ABNORMAL HIGH (ref 70–99)
Potassium: 4.7 mEq/L (ref 3.5–5.1)
Sodium: 138 mEq/L (ref 135–145)
Total Bilirubin: 0.4 mg/dL (ref 0.2–1.2)
Total Protein: 6.7 g/dL (ref 6.0–8.3)

## 2014-06-20 LAB — CBC
HEMATOCRIT: 36.4 % (ref 36.0–46.0)
HEMOGLOBIN: 12.6 g/dL (ref 12.0–15.0)
MCHC: 34.5 g/dL (ref 30.0–36.0)
MCV: 91.7 fl (ref 78.0–100.0)
PLATELETS: 183 10*3/uL (ref 150.0–400.0)
RBC: 3.97 Mil/uL (ref 3.87–5.11)
RDW: 13.1 % (ref 11.5–15.5)
WBC: 3.8 10*3/uL — ABNORMAL LOW (ref 4.0–10.5)

## 2014-06-20 LAB — LIPID PANEL
CHOL/HDL RATIO: 2
Cholesterol: 161 mg/dL (ref 0–200)
HDL: 70.3 mg/dL (ref >39.00)
LDL CALC: 79 mg/dL (ref 0–99)
NonHDL: 90.7
Triglycerides: 57 mg/dL (ref 0.0–149.0)
VLDL: 11.4 mg/dL (ref 0.0–40.0)

## 2014-06-20 NOTE — Progress Notes (Signed)
Pre visit review using our clinic review tool, if applicable. No additional management support is needed unless otherwise documented below in the visit note. 

## 2014-06-20 NOTE — Progress Notes (Signed)
Subjective:    Patient ID: Lindsay Collins, female    DOB: 12/31/1985, 29 y.o.   MRN: [id number]  HPI  Pt presents to the clinic today for her annual exam.  Flu: 12/2013 Tetanus: 2013 LMP: Hysterectomy Pap Smear: 05/2013 abnormal, saw GYN for colposcopy, had hysterectomy Dentist: biannually  Review of Systems      Past Medical History  Diagnosis Date  . Nephrolithiasis     Current Outpatient Prescriptions  Medication Sig Dispense Refill  . ALPRAZolam (XANAX) 0.5 MG tablet Take 1 tablet (0.5 mg total) by mouth 2 (two) times daily as needed for anxiety. 30 tablet 0  . azithromycin (ZITHROMAX) 500 MG tablet Take 2 tablets (1,000 mg total) by mouth daily. 2 tablet 0  . fluconazole (DIFLUCAN) 150 MG tablet Take 1 tablet (150 mg total) by mouth once. 1 tablet 1  . permethrin (ELIMITE) 5 % cream Apply 1 application topically once. 60 g 0  . sertraline (ZOLOFT) 25 MG tablet Take 1 tablet (25 mg total) by mouth at bedtime. 30 tablet 2   No current facility-administered medications for this visit.    No Known Allergies  Family History  Problem Relation Age of Onset  . COPD Mother   . COPD Father   . Cancer Father     prostate    History   Social History  . Marital Status: Single    Spouse Name: N/A  . Number of Children: N/A  . Years of Education: N/A   Occupational History  . Not on file.   Social History Main Topics  . Smoking status: Never Smoker   . Smokeless tobacco: Not on file  . Alcohol Use: No  . Drug Use: No  . Sexual Activity: Yes   Other Topics Concern  . Not on file   Social History Narrative     Constitutional: Denies fever, malaise, fatigue, headache or abrupt weight changes.  HEENT: Denies eye pain, eye redness, ear pain, ringing in the ears, wax buildup, runny nose, nasal congestion, bloody nose, or sore throat. Respiratory: Denies difficulty breathing, shortness of breath, cough or sputum production.   Cardiovascular: Denies chest pain,  chest tightness, palpitations or swelling in the hands or feet.  Gastrointestinal: Denies abdominal pain, bloating, constipation, diarrhea or blood in the stool.  GU: Denies urgency, frequency, pain with urination, burning sensation, blood in urine, odor or discharge. Musculoskeletal: Denies decrease in range of motion, difficulty with gait, muscle pain or joint pain and swelling.  Skin: Denies redness, rashes, lesions or ulcercations.  Neurological: Denies dizziness, difficulty with memory, difficulty with speech or problems with balance and coordination.  Psych: Pt reports anxiety. Denies depression, SI/HI.   No other specific complaints in a complete review of systems (except as listed in HPI above).  Objective:   Physical Exam   BP 106/68 mmHg  Pulse 69  Temp(Src) 98.1 F (36.7 C) (Oral)  Ht  (1.626 m)  Wt 141 lb (63.957 kg)  BMI 24.19 kg/m2  SpO2 99% Wt Readings from Last 3 Encounters:  06/20/14 141 lb (63.957 kg)  04/29/14 143 lb 8 oz (65.091 kg)  02/14/14 140 lb (63.504 kg)    General: Appears her stated age, well developed, well nourished in NAD. Skin: Warm, dry and intact. No rashes, lesions or ulcerations noted. HEENT: Head: normal shape and size; Eyes: sclera white, no icterus, conjunctiva pink, PERRLA and EOMs intact; Ears: Tm's gray and intact, normal light reflex; Nose: mucosa pink and moist, septum  midline; Throat/Mouth: Teeth present, mucosa pink and moist, no exudate, lesions or ulcerations noted.  Neck:. Neck supple, trachea midline. No masses, lumps or thyromegaly present.  Cardiovascular: Normal rate and rhythm. S1,S2 noted.  No murmur, rubs or gallops noted.  Pulmonary/Chest: Normal effort and positive vesicular breath sounds. No respiratory distress. No wheezes, rales or ronchi noted.  Abdomen: Soft and nontender. Normal bowel sounds, no bruits noted. No distention or masses noted. Liver, spleen and kidneys non palpable. Musculoskeletal: Normal range of  motion. No signs of joint swelling. Strength 5/5 BUE/BLE. No difficulty with gait.  Neurological: Alert and oriented. Cranial nerves II-XII grossly intact. Coordination normal.  Psychiatric: Mood and affect normal. Behavior is normal. Judgment and thought content normal.     BMET    Component Value Date/Time   NA 140 06/11/2013 0939   K 4.3 06/11/2013 0939   CL 107 06/11/2013 0939   CO2 26 06/11/2013 0939   GLUCOSE 95 06/11/2013 0939   BUN 16 06/11/2013 0939   CREATININE 1.0 06/11/2013 0939   CALCIUM 9.6 06/11/2013 0939    Lipid Panel     Component Value Date/Time   CHOL 147 06/11/2013 0939   TRIG 36.0 06/11/2013 0939   HDL 56.30 06/11/2013 0939   CHOLHDL 3 06/11/2013 0939   VLDL 7.2 06/11/2013 0939   LDLCALC 84 06/11/2013 0939    CBC    Component Value Date/Time   WBC 4.0* 06/11/2013 0939   RBC 4.17 06/11/2013 0939   HGB 13.2 06/11/2013 0939   HCT 38.9 06/11/2013 0939   PLT 230.0 06/11/2013 0939   MCV 93.1 06/11/2013 0939   MCHC 33.9 06/11/2013 0939   RDW 12.9 06/11/2013 0939   LYMPHSABS 1.4 06/11/2013 0939   MONOABS 0.3 06/11/2013 0939   EOSABS 0.0 06/11/2013 0939   BASOSABS 0.0 06/11/2013 0939    Hgb A1C No results found for: HGBA1C      Assessment & Plan:   Preventative Health Maintenance:  Encouraged her to work on diet and exercise Flu and TD up to date Will check CBC, CMET, TSH and Lipid today  RTC in 1 year or sooner if needed

## 2014-06-20 NOTE — Addendum Note (Signed)
Addended by: Alvina ChouWALSH, Ramanda Paules J on: 06/20/2014 03:04 PM   Modules accepted: Orders

## 2014-06-20 NOTE — Patient Instructions (Signed)

## 2014-06-21 LAB — HIV ANTIBODY (ROUTINE TESTING W REFLEX): HIV 1&2 Ab, 4th Generation: NONREACTIVE

## 2014-06-21 LAB — RPR

## 2014-07-04 ENCOUNTER — Encounter: Payer: Self-pay | Admitting: Internal Medicine

## 2014-07-04 ENCOUNTER — Other Ambulatory Visit: Payer: Self-pay | Admitting: Internal Medicine

## 2014-07-04 ENCOUNTER — Telehealth: Payer: Self-pay | Admitting: *Deleted

## 2014-07-04 MED ORDER — FLUCONAZOLE 150 MG PO TABS: 150.0000 mg | ORAL_TABLET | Freq: Once | ORAL | Status: DC

## 2014-07-04 NOTE — Telephone Encounter (Signed)
Diflucan was called in

## 2014-07-04 NOTE — Telephone Encounter (Signed)
PLEASE NOTE: All timestamps contained within this report are represented as [place] Standard Time. CONFIDENTIALTY NOTICE: This fax transmission is intended only for the addressee. It contains information that is legally privileged, confidential or otherwise protected from use or disclosure. If you are not the intended recipient, you are strictly prohibited from reviewing, disclosing, copying using or disseminating any of this information or taking any action in reliance on or regarding this information. If you have received this fax in error, please notify us immediately by telephone so that we can arrange for its return to us. Phone: [phone], Toll-Free: [phone], Fax: [phone] Page: 1 of 3 Call Id: [id number] [facility] - Client TELEPHONE ADVICE RECORD [facility] Patient Name: Lindsay Collins MondayLICIA Collins Gender: Female DOB: 07/24/1985 Age: [age] Y 3 M 10 D Return Phone Number: [phone] (Primary) Address: [address] City/State/Zip: [place] [id number] Client [facility] Client [facility] Site [facility] - Night Physician Nicki ReaperBaity, Wen Munford Contact Type Call Call Type Triage / Clinical Relationship To Patient Self Return Phone Number [phone] (Primary) Chief Complaint Vaginal Discharge Initial Comment Caller states she has a yeast infection that cream is not helping. Nurse Assessment Nurse: Su Hiltoberts, RN, Werner LeanShamia Date/Time (Eastern Time): 07/02/2014 5:39:25 PM Confirm and document reason for call. If symptomatic, describe symptoms. ---Caller states that she has yeast infection in the past and having same symptoms now but cream is not working and didn't in the past either. Caller states that last time she had this a pill was called in and that cleared it up. Has the patient traveled out of the country within the last 30 days? ---No Does the patient require triage? ---No Please document  clinical information provided and list any resource used. ---Caller states [facility] [phone] so she can pick up prescription tonight. Per standing orders going to order Diflucan as caller stated that is what worked best for her before. Caller also stated that if it doesn't seem to help then she will call office on Monday for follow-up. Guidelines Guideline Title Affirmed Question Affirmed Notes Nurse Date/Time (Eastern Time) Disp. Time Lamount Cohen(Eastern Time) Disposition Final User 07/02/2014 6:00:32 PM Pharmacy Call Su Hiltoberts, RN, Werner LeanShamia [facility] 07/02/2014 6:00:41 PM Clinical Call Yes Su Hiltoberts, RN, Werner LeanShamia After Care Instructions Given Call Event Type User Date / Time Description PLEASE NOTE: All timestamps contained within this report are represented as [place] Standard Time. CONFIDENTIALTY NOTICE: This fax transmission is intended only for the addressee. It contains information that is legally privileged, confidential or otherwise protected from use or disclosure. If you are not the intended recipient, you are strictly prohibited from reviewing, disclosing, copying using or disseminating any of this information or taking any action in reliance on or regarding this information. If you have received this fax in error, please notify us immediately by telephone so that we can arrange for its return to us. Phone: [phone], Toll-Free: [phone], Fax: [phone] Page: 2 of 3 Call Id: [id number] Standing Orders Preparation Additional Instructions Route Frequency Duration Nurse Comments User Name Diflucan 150 mg 1 tablet Times 1 dose only Oral Su Hiltoberts, RN, PPJKDTShamia Comments User: Cipriano MileShamia, Roberts, RN Date/Time (Eastern Time): 07/02/2014 6:00:17 PM Spoke with caller and let her know medication as ordered as requested, but that if she gets worse to please call back. Caller verbalized understanding. PLEASE NOTE: All timestamps contained within this report are represented as [place]  Standard Time. CONFIDENTIALTY NOTICE: This fax transmission is intended  only for the addressee. It contains information that is legally privileged, confidential or otherwise protected from use or disclosure. If you are not the intended recipient, you are strictly prohibited from reviewing, disclosing, copying using or disseminating any of this information or taking any action in reliance on or regarding this information. If you have received this fax in error, please notify us immediately by telephone so that we can arrange for its return to [place]. Phone: [phone], Toll-Free: [phone], Fax: [phone] Page: 3 of 3 Call Id: [id number] [facility] [address], [address] [place], [place] [address] [phone] Fax: [phone] MEDICATION ORDER [facility] Night - Client [facility] - Night Date: 07/02/2014 From: [facility] Department To: Nicki Reaper This is an approved standing order given by our call center nurse on your behalf. Fax to [phone] within 5 business days. Thank you. Date Lamount Cohen Time): 07/02/2014 4:59:25 PM Triage RN: Cipriano Mile, RN NAME: Lindsay Collins PHONE NUMBER: [phone] (Primary) BIRTHDATE: 05/11/1985 ADDRESS: [address] CITY/STATE/ZIP: [place] [address] CALLER: Self NAME: Rx Given Preparation Additional Instructions Route Frequency Duration Nurse Comments User Name Diflucan 150 mg 1 tablet Times 1 dose only Oral Su Hilt, RN, Shamia No signature is required on standing orders.

## 2014-07-12 ENCOUNTER — Other Ambulatory Visit: Payer: Self-pay | Admitting: Internal Medicine

## 2014-07-12 MED ORDER — FEXOFENADINE HCL 180 MG PO TABS: 180.0000 mg | ORAL_TABLET | Freq: Every day | ORAL | Status: DC

## 2014-07-12 MED ORDER — LEVOCETIRIZINE DIHYDROCHLORIDE 5 MG PO TABS: 5.0000 mg | ORAL_TABLET | Freq: Every evening | ORAL | Status: DC

## 2014-07-20 ENCOUNTER — Other Ambulatory Visit: Payer: Self-pay | Admitting: Internal Medicine

## 2014-07-20 MED ORDER — PREDNISONE 10 MG PO TABS: ORAL_TABLET | ORAL | Status: DC

## 2014-07-23 NOTE — Op Note (Signed)
PATIENT NAME:  Lindsay Collins, Lindsay Collins MR#:  [id number] DATE OF BIRTH:  20-Feb-1986  DATE OF PROCEDURE:  09/21/2013  PREOPERATIVE DIAGNOSES: 1.  Severe cervical dysplasia.  2.  Strong patient desire for definitive surgical management.    POSTOPERATIVE DIAGNOSES:  1.  Severe cervical dysplasia.  2.  Strong patient desire for definitive surgical management.    PROCEDURES: 1.  Robot-assisted total laparoscopic hysterectomy, bilateral salpingectomy with conversion to open laparotomy for colpotomy and vaginal cuff closure.  2.  Cystotomy.   ANESTHESIA: General.   SURGEON: Thomasene MohairStephen Jackson, MD   ASSISTANT SURGEON: Vena AustriaAndreas Staebler, M.D.   ESTIMATED BLOOD LOSS: 300 mL.   OPERATIVE FLUIDS: 2300 mL crystalloid.   COMPLICATIONS: Uterine perforation with VCare device on the right lateral aspect of the uterus, not resulting in significant blood loss but resulting in an inability to affix the Koh ring to the vaginal fornices.   FINDINGS: 1.  Normal-appearing uterus, fallopian tubes and ovaries.  2.  Stenotic external cervical os.  3.  Uterine perforation of VCare device with not a significant amount of bleeding and no apparent  damage to nearby organs outside of the uterus.  4.  Bilateral efflux from the ureters, and no apparent defects in the wall of the bladder on cystoscopy, which was performed twice throughout the procedure.   SPECIMENS: Uterus, cervix and fallopian tubes.   CONDITION AT THE END OF PROCEDURE: Stable.   PROCEDURE IN DETAIL: The patient was taken to the Operating Room, where general anesthesia was administered and found to be adequate. She was placed in the dorsal supine lithotomy position in [place] stirrups and prepped and draped in the usual sterile fashion. The patient was verified to be in a position, affixed on the table was so that she would not slide along the table during steep Trendelenburg. Further, she was positioned in such a way as to protect nerves. With this satisfied,  the patient was then prepped and draped in the usual sterile fashion. After a timeout was called, an indwelling Foley catheter was placed in her bladder, a sterile speculum was placed in the vagina, and a single-tooth tenaculum was affixed to the anterior lip of the cervix. There was a noted stenotic cervix, which required lacrimal duct probes to dilate to, eventually, a size large enough to allow accommodate a medium V-Care device. The uterus sounded to approximately 8 cm. The VCare  device was placed without difficulty, the Koh rings were affixed the vaginal fornices, and the device was affixed to the cervix according to the manufacturer's recommendations.   Attention was turned to the abdomen where, after injection of local anesthetic, an 8 mm supraumbilical incision was made with a scalpel, using direct camera visualization through a trocar, entry into the abdomen was successfully obtained. The trocar was connected to CO2 and opening pressure verified successful entry. After insufflation with CO2 of the abdomen, the camera was reintroduced through the port, and atraumatic entry was verified. Two right flank ports were placed according to the manufacturer's recommendations for the robot. The right upper quadrant one was 8 mm, the right lower quadrant was 11 mm. They were placed according to the recommendations of the [employer] robot manufacturer.   A left flank port was placed under direct intra-abdominal camera visualization in symmetry with the right upper quadrant port. This was also an 8 mm port. After successful port placement, a survey of the abdomen and pelvis was undertaken, with the finding of the [facility] device having perforated the uterus  on the right. There was a small amount of blood in the posterior cul-de-sac, but no active bleeding noted. At this point, the robot was docked successfully on the patient's left side, and the right monopolar scissors were placed in the right upper quadrant port and  power was attached, and the bipolar fenestrated forceps were placed in the left upper quadrant port and power cable was attached. The camera was affixed in the correct manner.   Attention was turned to the right fallopian tube, where the mesosalpinx was bipolar electrocauterized and transected with the scissors, and then removed in its entirety and then removed through from the body through the right lower quadrant port. Then the right utero-ovarian ligament was bipolar electrocauterized and transected with the scissors. After transection of the round ligament, the anterior broad ligament was then transected down to the level of the internal os, and a bladder flap was then developed. It should be noted that, prior to any of the surgery on the fallopian tube, the right and left ureters were identified and found to be peristalsing well away from the operative area of interest.   The posterior broad ligament was transected in a similar fashion. The right uterine artery was skeletonized, bipolar electrocauterized and transected with the scissors. The perforation of the VCare device was found to be at a level just below the uterosacral ligament and to the right lateral side of the patient. A similar procedure was carried out on the patient's left side, and all of the blood supply was freed up. Attempt was made to push just on the cups of VCare device without actually placing any pressure on the rod portion of the device; however, this still provided insufficient countertraction and movement of the ureters away from the cervix for successful colpotomy. Colpotomy was attempted, however, it appeared that colpotomy was not successful and that risk of damaging nearby organs was imminent. The decision was made to attempt a vaginal approach, and the surgery was then converted to a vaginal procedure.   After undocking of the robot was obtained and the abdomen was desufflated of CO2, all instrumentation was removed from the  vagina. A deep weighted speculum was placed in the vagina and a Deaver was used to gain visualization. The patient had only a small amount of cervix that was manipulable. An attempt was made to circumferentially enter the epithelium around the cervix and enter posteriorly.  Entry was successful posteriorly, however, at this point, visualization was determined not to be sufficient and risk of damage to bowel and bladder was felt to be unacceptably high. Of note, there were air bubbles noted to be in the Foley catheter during this portion of the surgery. With concern for a bladder or ureteral injury, a cystoscopy was undertaken. Bilateral efflux from the ureteral orifices was noted, and no obvious defects were noted in the bladder or bladder wall, and the bladder maintained the filling of the sterile saline without difficulty. At this point the cystoscopy was terminated, and decision to make an open abdominal incision to complete the surgery was made, given the safety issue of causing any damage to bowel or bladder.   A Pfannenstiel incision was made with the scalpel and carried through the various layers until the peritoneum was identified and entered sharply. The bowel was packed away using a single damp/moist lap sponge and, using a wide malleable retractor, the bowels were held out of the operative area of interest. A double-tooth tenaculum was used to grasp the uterus  along the fundus, and the uterus was elevated to a level where surgery was possible. The bladder retractor was then used to verify that a sufficient bladder flap was created, which was found to be the case. It required only a single Heaney clamp, curved along each side, to gain entrance to perform the colpotomy.   At this point, the uterus and cervix were liberated from the vaginal connection, and the bilateral Heaney clamp pedicles were suture ligated. Of note, at this point, the posterior colpotomy was not connected to the colpotomy obtained  abdominally.  So, there was a piece of vaginal tissue that was interposed between the two colpotomies.  Therefore, this interpositioned piece of vaginal tissue, which was relatively small, approximately 0.5 cm in width, was removed, and the vagina was closed using interrupted figure-of-eight sutures. The vaginal closure was found to be complete, and hemostasis was noted after copious irrigation. All pedicle lines were inspected and found to be hemostatic, and any sutures were removed as well as lap sponges and retractors.   The On-Q pump catheters were placed according to the manufacturer's recommendations. They were placed approximately 4 cm cephalad to the incision line, approximately 1 cm apart, straddling the midline. They were positioned at a level to approximately the third mark on the catheter and positioned just superficial to the rectus abdominis muscle and just deep to the rectus fascia.   The fascia was closed using #0 Maxon, using two separate sutures, each starting at the lateral apices and meeting in the midline, where they were tied together. The skin was closed using 4-0 Monocryl in a subcuticular fashion, and this closure was reinforced using Dermabond. The remaining four incision sites from the [employer] robot were all closed in a subcuticular fashion, using 4-0 Monocryl and reinforced using Dermabond. Local anesthetic was then injected at each of the smaller incision sites.   The On-Q catheters were each bolused with 10 mL of 0.5% Marcaine plain for a total of 10 mL.  A second cystoscopy was performed due to the amount of stitches need to be thrown at the right vaginal angle and concern for ureteral damage. Again, successful bilateral efflux from the ureteral orifices was noted, and no damage to the bladder wall was noted either. An indwelling catheter was replaced in the bladder at the end of this procedure. This terminated the procedure.   The patient tolerated the procedure well.  Sponge, lap, and needle counts were correct x2. For VTE prophylaxis, the patient was wearing pneumatic compression stockings which were on and operating throughout the entire procedure. Prior to skin incision, the patient received Ancef 2 grams and, given the length of the procedure, required a second dose of Ancef 1 gram. The patient was awakened in the Operating Room and taken to the recovery area in stable condition.     ____________________________ Conard Novak, MD sdj:cg D: 09/21/2013 21:08:54 ET T: 09/22/2013 01:49:06 ET JOB#: [id number]  cc: Conard Novak, MD, <Dictator> Conard Novak MD ELECTRONICALLY SIGNED 10/09/2013 11:49

## 2014-08-25 ENCOUNTER — Encounter: Payer: Self-pay | Admitting: Internal Medicine

## 2014-09-26 ENCOUNTER — Encounter: Payer: Self-pay | Admitting: Internal Medicine

## 2014-09-27 ENCOUNTER — Other Ambulatory Visit: Payer: Self-pay

## 2014-09-27 MED ORDER — ALPRAZOLAM 0.5 MG PO TABS: 0.5000 mg | ORAL_TABLET | Freq: Two times a day (BID) | ORAL | Status: DC | PRN

## 2014-10-10 ENCOUNTER — Encounter: Payer: Self-pay | Admitting: Internal Medicine

## 2014-10-24 ENCOUNTER — Encounter: Payer: Self-pay | Admitting: Internal Medicine

## 2014-10-25 ENCOUNTER — Other Ambulatory Visit: Payer: Self-pay | Admitting: Internal Medicine

## 2014-10-25 MED ORDER — METHOCARBAMOL 500 MG PO TABS
500.0000 mg | ORAL_TABLET | Freq: Every evening | ORAL | Status: DC | PRN
Start: 2014-10-25 — End: 2015-08-14

## 2014-12-23 ENCOUNTER — Other Ambulatory Visit: Payer: Self-pay | Admitting: Internal Medicine

## 2014-12-23 ENCOUNTER — Telehealth: Payer: Self-pay | Admitting: Internal Medicine

## 2014-12-23 ENCOUNTER — Telehealth: Payer: Self-pay

## 2014-12-23 DIAGNOSIS — N644 Mastodynia: Secondary | ICD-10-CM

## 2014-12-23 MED ORDER — PREDNISONE 20 MG PO TABS: 20.0000 mg | ORAL_TABLET | Freq: Every day | ORAL | Status: DC

## 2014-12-23 NOTE — Telephone Encounter (Signed)
Sent mychart message

## 2014-12-23 NOTE — Telephone Encounter (Signed)
Error

## 2015-01-02 ENCOUNTER — Ambulatory Visit: Payer: Commercial Managed Care - PPO

## 2015-01-12 ENCOUNTER — Encounter: Payer: Self-pay | Admitting: Internal Medicine

## 2015-01-18 ENCOUNTER — Encounter: Payer: Self-pay | Admitting: Internal Medicine

## 2015-01-19 ENCOUNTER — Other Ambulatory Visit: Payer: Self-pay | Admitting: Internal Medicine

## 2015-01-19 MED ORDER — HYDROCODONE-HOMATROPINE 5-1.5 MG/5ML PO SYRP: 5.0000 mL | ORAL_SOLUTION | Freq: Three times a day (TID) | ORAL | Status: DC | PRN

## 2015-03-13 ENCOUNTER — Encounter: Payer: Self-pay | Admitting: Internal Medicine

## 2015-03-16 ENCOUNTER — Other Ambulatory Visit: Payer: Self-pay | Admitting: Internal Medicine

## 2015-03-16 DIAGNOSIS — R5383 Other fatigue: Secondary | ICD-10-CM

## 2015-03-16 DIAGNOSIS — Z0000 Encounter for general adult medical examination without abnormal findings: Secondary | ICD-10-CM

## 2015-03-17 ENCOUNTER — Encounter: Payer: Self-pay | Admitting: Internal Medicine

## 2015-03-17 ENCOUNTER — Other Ambulatory Visit (INDEPENDENT_AMBULATORY_CARE_PROVIDER_SITE_OTHER): Payer: Commercial Managed Care - PPO

## 2015-03-17 DIAGNOSIS — R5383 Other fatigue: Secondary | ICD-10-CM

## 2015-03-17 DIAGNOSIS — Z0000 Encounter for general adult medical examination without abnormal findings: Secondary | ICD-10-CM

## 2015-03-17 LAB — COMPREHENSIVE METABOLIC PANEL
ALK PHOS: 50 U/L (ref 39–117)
ALT: 10 U/L (ref 0–35)
AST: 14 U/L (ref 0–37)
Albumin: 4.5 g/dL (ref 3.5–5.2)
BUN: 13 mg/dL (ref 6–23)
CHLORIDE: 103 meq/L (ref 96–112)
CO2: 29 mEq/L (ref 19–32)
Calcium: 9.5 mg/dL (ref 8.4–10.5)
Creatinine, Ser: 0.92 mg/dL (ref 0.40–1.20)
GFR: 76.19 mL/min (ref >60.00)
GLUCOSE: 107 mg/dL — AB (ref 70–99)
POTASSIUM: 4.6 meq/L (ref 3.5–5.1)
SODIUM: 138 meq/L (ref 135–145)
TOTAL PROTEIN: 6.8 g/dL (ref 6.0–8.3)
Total Bilirubin: 0.7 mg/dL (ref 0.2–1.2)

## 2015-03-17 LAB — CBC
HEMATOCRIT: 39.1 % (ref 36.0–46.0)
Hemoglobin: 13.3 g/dL (ref 12.0–15.0)
MCHC: 34 g/dL (ref 30.0–36.0)
MCV: 92.8 fl (ref 78.0–100.0)
Platelets: 211 10*3/uL (ref 150.0–400.0)
RBC: 4.21 Mil/uL (ref 3.87–5.11)
RDW: 13 % (ref 11.5–15.5)
WBC: 4.4 10*3/uL (ref 4.0–10.5)

## 2015-03-17 LAB — VITAMIN B12: Vitamin B-12: 257 pg/mL (ref 211–911)

## 2015-03-17 LAB — LIPID PANEL
CHOL/HDL RATIO: 2
Cholesterol: 156 mg/dL (ref 0–200)
HDL: 69.7 mg/dL (ref >39.00)
LDL Cholesterol: 79 mg/dL (ref 0–99)
NONHDL: 86.02
Triglycerides: 36 mg/dL (ref 0.0–149.0)
VLDL: 7.2 mg/dL (ref 0.0–40.0)

## 2015-03-17 LAB — VITAMIN D 25 HYDROXY (VIT D DEFICIENCY, FRACTURES): VITD: 68.13 ng/mL (ref 30.00–100.00)

## 2015-03-17 LAB — FOLATE: Folate: 12.3 ng/mL (ref >5.9)

## 2015-03-17 LAB — TSH: TSH: 0.98 u[IU]/mL (ref 0.35–4.50)

## 2015-03-30 ENCOUNTER — Encounter: Payer: Self-pay | Admitting: Internal Medicine

## 2015-03-30 MED ORDER — AZITHROMYCIN 250 MG PO TABS: ORAL_TABLET | ORAL | Status: DC

## 2015-04-27 ENCOUNTER — Encounter: Payer: Self-pay | Admitting: Internal Medicine

## 2015-04-27 MED ORDER — BUTALBITAL-APAP-CAFFEINE 50-325-40 MG PO TABS: 1.0000 | ORAL_TABLET | Freq: Four times a day (QID) | ORAL | Status: DC | PRN

## 2015-05-16 ENCOUNTER — Other Ambulatory Visit: Payer: Self-pay | Admitting: Internal Medicine

## 2015-05-16 MED ORDER — AMOXICILLIN-POT CLAVULANATE 875-125 MG PO TABS: 1.0000 | ORAL_TABLET | Freq: Two times a day (BID) | ORAL | Status: DC

## 2015-07-17 ENCOUNTER — Telehealth: Payer: Self-pay | Admitting: Internal Medicine

## 2015-07-17 NOTE — Telephone Encounter (Signed)
Pt called stating she as already spoke with regina She has posion ivey and wanted to know if something could be called in for this  [facility]  Please advise Pt would like a call back today

## 2015-07-17 NOTE — Telephone Encounter (Signed)
[name] is out of the office, she will need to schedule an office visit for treatment.

## 2015-07-17 NOTE — Telephone Encounter (Signed)
Called and notified patient of Kate's comments. Patient stated that she can't come in due working 7 to 7 this week. Notified patient that she need to be seen for treatment and another option for patient is [facility]

## 2015-08-14 ENCOUNTER — Ambulatory Visit (INDEPENDENT_AMBULATORY_CARE_PROVIDER_SITE_OTHER): Payer: Commercial Managed Care - PPO | Admitting: Internal Medicine

## 2015-08-14 ENCOUNTER — Encounter: Payer: Self-pay | Admitting: Internal Medicine

## 2015-08-14 VITALS — BP 104/66 | HR 84 | Temp 98.3°F | Wt 135.5 lb

## 2015-08-14 DIAGNOSIS — L02415 Cutaneous abscess of right lower limb: Secondary | ICD-10-CM | POA: Diagnosis not present

## 2015-08-14 MED ORDER — SULFAMETHOXAZOLE-TRIMETHOPRIM 800-160 MG PO TABS: 1.0000 | ORAL_TABLET | Freq: Two times a day (BID) | ORAL | Status: DC

## 2015-08-14 NOTE — Progress Notes (Signed)
Subjective:    Patient ID: Lindsay Collins, female    DOB: Dec 28, 1985, 30 y.o.   MRN: [id number]  HPI  Pt presents to the clinic today with c/o a bump in her right groin. She first noticed this 3 months ago. She reports the bump has increased in size. It is tender to touch. She denies redness but has noticed some warmth. She has not tried anything OTC for this. She denies vaginal or urinary complaints.  Review of Systems      Past Medical History  Diagnosis Date  . Nephrolithiasis     Current Outpatient Prescriptions  Medication Sig Dispense Refill  . butalbital-acetaminophen-caffeine (FIORICET, ESGIC) 50-325-40 MG tablet Take 1 tablet by mouth every 6 (six) hours as needed for headache. 30 tablet 1   No current facility-administered medications for this visit.    No Known Allergies  Family History  Problem Relation Age of Onset  . COPD Mother   . COPD Father   . Cancer Father     prostate    Social History   Social History  . Marital Status: Single    Spouse Name: N/A  . Number of Children: N/A  . Years of Education: N/A   Occupational History  . Not on file.   Social History Main Topics  . Smoking status: Never Smoker   . Smokeless tobacco: Not on file  . Alcohol Use: No  . Drug Use: No  . Sexual Activity: Yes   Other Topics Concern  . Not on file   Social History Narrative     Constitutional: Denies fever, malaise, fatigue, headache or abrupt weight changes.  Respiratory: Denies difficulty breathing, shortness of breath, cough or sputum production.   Cardiovascular: Denies chest pain, chest tightness, palpitations or swelling in the hands or feet.  Gastrointestinal: Denies abdominal pain, bloating, constipation, diarrhea or blood in the stool.  GU: Denies urgency, frequency, pain with urination, burning sensation, blood in urine, odor or discharge. Skin: Pt reports bump in her right groin. Denies redness, rashes, or ulcercations.   No other  specific complaints in a complete review of systems (except as listed in HPI above).  Objective:   Physical Exam  BP 104/66 mmHg  Pulse 84  Temp(Src) 98.3 F (36.8 C) (Oral)  Wt 135 lb 8 oz (61.462 kg)  SpO2 98% Wt Readings from Last 3 Encounters:  08/14/15 135 lb 8 oz (61.462 kg)  06/20/14 141 lb (63.957 kg)  04/29/14 143 lb 8 oz (65.091 kg)    General: Appears her stated age, well developed, well nourished in NAD. Skin: 1 cm non fluctuant abscess noted in right groin. No head noted, no surrounding cellulitis.    BMET    Component Value Date/Time   NA 138 03/17/2015 0911   NA 138 09/23/2013 0536   K 4.6 03/17/2015 0911   K 3.9 09/23/2013 0536   CL 103 03/17/2015 0911   CL 105 09/23/2013 0536   CO2 29 03/17/2015 0911   CO2 29 09/23/2013 0536   GLUCOSE 107* 03/17/2015 0911   GLUCOSE 131* 09/23/2013 0536   BUN 13 03/17/2015 0911   BUN 13 09/23/2013 0536   CREATININE 0.92 03/17/2015 0911   CREATININE 0.83 09/23/2013 0536   CALCIUM 9.5 03/17/2015 0911   CALCIUM 8.5 09/23/2013 0536   GFRNONAA >60 09/23/2013 0536   GFRAA >60 09/23/2013 0536    Lipid Panel     Component Value Date/Time   CHOL 156 03/17/2015 0911   TRIG  36.0 03/17/2015 0911   HDL 69.70 03/17/2015 0911   CHOLHDL 2 03/17/2015 0911   VLDL 7.2 03/17/2015 0911   LDLCALC 79 03/17/2015 0911    CBC    Component Value Date/Time   WBC 4.4 03/17/2015 0911   WBC 4.7 09/23/2013 0536   RBC 4.21 03/17/2015 0911   RBC 3.08* 09/23/2013 0536   HGB 13.3 03/17/2015 0911   HGB 10.0* 09/23/2013 0536   HCT 39.1 03/17/2015 0911   HCT 28.8* 09/23/2013 0536   PLT 211.0 03/17/2015 0911   PLT 138* 09/23/2013 0536   MCV 92.8 03/17/2015 0911   MCV 93 09/23/2013 0536   MCH 32.5 09/23/2013 0536   MCHC 34.0 03/17/2015 0911   MCHC 34.8 09/23/2013 0536   RDW 13.0 03/17/2015 0911   RDW 13.6 09/23/2013 0536   LYMPHSABS 1.4 06/11/2013 0939   LYMPHSABS 1.9 02/25/2012 1424   MONOABS 0.3 06/11/2013 0939   MONOABS 0.4  02/25/2012 1424   EOSABS 0.0 06/11/2013 0939   EOSABS 0.1 02/25/2012 1424   BASOSABS 0.0 06/11/2013 0939   BASOSABS 0.0 02/25/2012 1424    Hgb A1C No results found for: HGBA1C       Assessment & Plan:   Abscess of right thigh:  eRx for Septra BID x 10 days Warm compresses TID Return precautions given  RTC as needed or if symptoms persist or worsen

## 2015-08-14 NOTE — Patient Instructions (Signed)

## 2015-08-14 NOTE — Progress Notes (Signed)
Pre visit review using our clinic review tool, if applicable. No additional management support is needed unless otherwise documented below in the visit note. 

## 2015-08-15 ENCOUNTER — Other Ambulatory Visit: Payer: Self-pay | Admitting: Internal Medicine

## 2015-08-15 MED ORDER — HYDROCODONE-ACETAMINOPHEN 5-325 MG PO TABS: 1.0000 | ORAL_TABLET | Freq: Four times a day (QID) | ORAL | Status: DC | PRN

## 2015-08-24 ENCOUNTER — Other Ambulatory Visit: Payer: Self-pay | Admitting: Internal Medicine

## 2015-08-24 MED ORDER — FLUCONAZOLE 150 MG PO TABS: 150.0000 mg | ORAL_TABLET | Freq: Once | ORAL | Status: DC

## 2015-08-29 ENCOUNTER — Other Ambulatory Visit: Payer: Self-pay | Admitting: Internal Medicine

## 2015-08-29 MED ORDER — FLUCONAZOLE 150 MG PO TABS: 150.0000 mg | ORAL_TABLET | Freq: Once | ORAL | Status: DC

## 2015-09-20 ENCOUNTER — Other Ambulatory Visit (HOSPITAL_COMMUNITY)
Admission: RE | Admit: 2015-09-20 | Discharge: 2015-09-20 | Disposition: A | Discharge status: Home / Self Care | Payer: Commercial Managed Care - PPO | Source: Ambulatory Visit | Attending: Internal Medicine | Admitting: Internal Medicine

## 2015-09-20 ENCOUNTER — Ambulatory Visit (INDEPENDENT_AMBULATORY_CARE_PROVIDER_SITE_OTHER): Payer: Commercial Managed Care - PPO | Admitting: Internal Medicine

## 2015-09-20 ENCOUNTER — Encounter: Payer: Self-pay | Admitting: Internal Medicine

## 2015-09-20 VITALS — BP 108/76 | HR 66 | Temp 98.9°F | Ht 64.0 in | Wt 139.0 lb

## 2015-09-20 DIAGNOSIS — Z01419 Encounter for gynecological examination (general) (routine) without abnormal findings: Secondary | ICD-10-CM | POA: Diagnosis not present

## 2015-09-20 DIAGNOSIS — Z113 Encounter for screening for infections with a predominantly sexual mode of transmission: Secondary | ICD-10-CM | POA: Insufficient documentation

## 2015-09-20 DIAGNOSIS — Z0000 Encounter for general adult medical examination without abnormal findings: Secondary | ICD-10-CM | POA: Diagnosis not present

## 2015-09-20 LAB — CBC
HEMATOCRIT: 36.1 % (ref 36.0–46.0)
HEMOGLOBIN: 12.4 g/dL (ref 12.0–15.0)
MCHC: 34.3 g/dL (ref 30.0–36.0)
MCV: 92.7 fl (ref 78.0–100.0)
PLATELETS: 203 10*3/uL (ref 150.0–400.0)
RBC: 3.89 Mil/uL (ref 3.87–5.11)
RDW: 13.7 % (ref 11.5–15.5)
WBC: 5.7 10*3/uL (ref 4.0–10.5)

## 2015-09-20 LAB — LIPID PANEL
CHOL/HDL RATIO: 2
CHOLESTEROL: 151 mg/dL (ref 0–200)
HDL: 67.1 mg/dL (ref >39.00)
LDL CALC: 67 mg/dL (ref 0–99)
NONHDL: 83.53
Triglycerides: 82 mg/dL (ref 0.0–149.0)
VLDL: 16.4 mg/dL (ref 0.0–40.0)

## 2015-09-20 LAB — COMPREHENSIVE METABOLIC PANEL
ALT: 11 U/L (ref 0–35)
AST: 12 U/L (ref 0–37)
Albumin: 4.5 g/dL (ref 3.5–5.2)
Alkaline Phosphatase: 40 U/L (ref 39–117)
BILIRUBIN TOTAL: 0.4 mg/dL (ref 0.2–1.2)
BUN: 14 mg/dL (ref 6–23)
CALCIUM: 9.6 mg/dL (ref 8.4–10.5)
CHLORIDE: 103 meq/L (ref 96–112)
CO2: 29 meq/L (ref 19–32)
Creatinine, Ser: 1.13 mg/dL (ref 0.40–1.20)
GFR: 59.89 mL/min — AB (ref >60.00)
Glucose, Bld: 93 mg/dL (ref 70–99)
Potassium: 4.2 mEq/L (ref 3.5–5.1)
Sodium: 137 mEq/L (ref 135–145)
Total Protein: 6.6 g/dL (ref 6.0–8.3)

## 2015-09-20 NOTE — Progress Notes (Signed)
Pre visit review using our clinic review tool, if applicable. No additional management support is needed unless otherwise documented below in the visit note. 

## 2015-09-20 NOTE — Addendum Note (Signed)
Addended by: Baldomero LamyHAVERS, Jaidence Geisler C on: 09/20/2015 04:40 PM   Modules accepted: Kipp BroodSmartSet

## 2015-09-20 NOTE — Addendum Note (Signed)
Addended by: Roena MaladyEVONTENNO, Tanaia Hawkey Y on: 09/20/2015 03:02 PM   Modules accepted: Orders, SmartSet

## 2015-09-20 NOTE — Patient Instructions (Signed)
Health Maintenance, Female Adopting a healthy lifestyle and getting preventive care can go a long way to promote health and wellness. Talk with your health care provider about what schedule of regular examinations is right for you. This is a good chance for you to check in with your provider about disease prevention and staying healthy. In between checkups, there are plenty of things you can do on your own. Experts have done a lot of research about which lifestyle changes and preventive measures are most likely to keep you healthy. Ask your health care provider for more information. WEIGHT AND DIET  Eat a healthy diet  Be sure to include plenty of vegetables, fruits, low-fat dairy products, and lean protein.  Do not eat a lot of foods high in solid fats, added sugars, or salt.  Get regular exercise. This is one of the most important things you can do for your health.  Most adults should exercise for at least 150 minutes each week. The exercise should increase your heart rate and make you sweat (moderate-intensity exercise).  Most adults should also do strengthening exercises at least twice a week. This is in addition to the moderate-intensity exercise.  Maintain a healthy weight  Body mass index (BMI) is a measurement that can be used to identify possible weight problems. It estimates body fat based on height and weight. Your health care provider can help determine your BMI and help you achieve or maintain a healthy weight.  For females 20 years of age and older:   A BMI below 18.5 is considered underweight.  A BMI of 18.5 to 24.9 is normal.  A BMI of 25 to 29.9 is considered overweight.  A BMI of 30 and above is considered obese.  Watch levels of cholesterol and blood lipids  You should start having your blood tested for lipids and cholesterol at 31 years of age, then have this test every 5 years.  You may need to have your cholesterol levels checked more often if:  Your lipid  or cholesterol levels are high.  You are older than 31 years of age.  You are at high risk for heart disease.  CANCER SCREENING   Lung Cancer  Lung cancer screening is recommended for adults 55-80 years old who are at high risk for lung cancer because of a history of smoking.  A yearly low-dose CT scan of the lungs is recommended for people who:  Currently smoke.  Have quit within the past 15 years.  Have at least a 30-pack-year history of smoking. A pack year is smoking an average of one pack of cigarettes a day for 1 year.  Yearly screening should continue until it has been 15 years since you quit.  Yearly screening should stop if you develop a health problem that would prevent you from having lung cancer treatment.  Breast Cancer  Practice breast self-awareness. This means understanding how your breasts normally appear and feel.  It also means doing regular breast self-exams. Let your health care provider know about any changes, no matter how small.  If you are in your 20s or 30s, you should have a clinical breast exam (CBE) by a health care provider every 1-3 years as part of a regular health exam.  If you are 40 or older, have a CBE every year. Also consider having a breast X-ray (mammogram) every year.  If you have a family history of breast cancer, talk to your health care provider about genetic screening.  If you   are at high risk for breast cancer, talk to your health care provider about having an MRI and a mammogram every year.  Breast cancer gene (BRCA) assessment is recommended for women who have family members with BRCA-related cancers. BRCA-related cancers include:  Breast.  Ovarian.  Tubal.  Peritoneal cancers.  Results of the assessment will determine the need for genetic counseling and BRCA1 and BRCA2 testing. Cervical Cancer Your health care provider may recommend that you be screened regularly for cancer of the pelvic organs (ovaries, uterus, and  vagina). This screening involves a pelvic examination, including checking for microscopic changes to the surface of your cervix (Pap test). You may be encouraged to have this screening done every 3 years, beginning at age 21.  For women ages 30-65, health care providers may recommend pelvic exams and Pap testing every 3 years, or they may recommend the Pap and pelvic exam, combined with testing for human papilloma virus (HPV), every 5 years. Some types of HPV increase your risk of cervical cancer. Testing for HPV may also be done on women of any age with unclear Pap test results.  Other health care providers may not recommend any screening for nonpregnant women who are considered low risk for pelvic cancer and who do not have symptoms. Ask your health care provider if a screening pelvic exam is right for you.  If you have had past treatment for cervical cancer or a condition that could lead to cancer, you need Pap tests and screening for cancer for at least 20 years after your treatment. If Pap tests have been discontinued, your risk factors (such as having a new sexual partner) need to be reassessed to determine if screening should resume. Some women have medical problems that increase the chance of getting cervical cancer. In these cases, your health care provider may recommend more frequent screening and Pap tests. Colorectal Cancer  This type of cancer can be detected and often prevented.  Routine colorectal cancer screening usually begins at 31 years of age and continues through 31 years of age.  Your health care provider may recommend screening at an earlier age if you have risk factors for colon cancer.  Your health care provider may also recommend using home test kits to check for hidden blood in the stool.  A small camera at the end of a tube can be used to examine your colon directly (sigmoidoscopy or colonoscopy). This is done to check for the earliest forms of colorectal  cancer.  Routine screening usually begins at age 50.  Direct examination of the colon should be repeated every 5-10 years through 31 years of age. However, you may need to be screened more often if early forms of precancerous polyps or small growths are found. Skin Cancer  Check your skin from head to toe regularly.  Tell your health care provider about any new moles or changes in moles, especially if there is a change in a mole's shape or color.  Also tell your health care provider if you have a mole that is larger than the size of a pencil eraser.  Always use sunscreen. Apply sunscreen liberally and repeatedly throughout the day.  Protect yourself by wearing long sleeves, pants, a wide-brimmed hat, and sunglasses whenever you are outside. HEART DISEASE, DIABETES, AND HIGH BLOOD PRESSURE   High blood pressure causes heart disease and increases the risk of stroke. High blood pressure is more likely to develop in:  People who have blood pressure in the high end   of the normal range (130-139/85-89 mm Hg).  People who are overweight or obese.  People who are African American.  If you are 38-23 years of age, have your blood pressure checked every 3-5 years. If you are 61 years of age or older, have your blood pressure checked every year. You should have your blood pressure measured twice--once when you are at a hospital or clinic, and once when you are not at a hospital or clinic. Record the average of the two measurements. To check your blood pressure when you are not at a hospital or clinic, you can use:  An automated blood pressure machine at a pharmacy.  A home blood pressure monitor.  If you are between 45 years and 39 years old, ask your health care provider if you should take aspirin to prevent strokes.  Have regular diabetes screenings. This involves taking a blood sample to check your fasting blood sugar level.  If you are at a normal weight and have a low risk for diabetes,  have this test once every three years after 31 years of age.  If you are overweight and have a high risk for diabetes, consider being tested at a younger age or more often. PREVENTING INFECTION  Hepatitis B  If you have a higher risk for hepatitis B, you should be screened for this virus. You are considered at high risk for hepatitis B if:  You were born in a country where hepatitis B is common. Ask your health care provider which countries are considered high risk.  Your parents were born in a high-risk country, and you have not been immunized against hepatitis B (hepatitis B vaccine).  You have HIV or AIDS.  You use needles to inject street drugs.  You live with someone who has hepatitis B.  You have had sex with someone who has hepatitis B.  You get hemodialysis treatment.  You take certain medicines for conditions, including cancer, organ transplantation, and autoimmune conditions. Hepatitis C  Blood testing is recommended for:  Everyone born from [birth year] through 1966.  Anyone with known risk factors for hepatitis C. Sexually transmitted infections (STIs)  You should be screened for sexually transmitted infections (STIs) including gonorrhea and chlamydia if:  You are sexually active and are younger than 31 years of age.  You are older than 31 years of age and your health care provider tells you that you are at risk for this type of infection.  Your sexual activity has changed since you were last screened and you are at an increased risk for chlamydia or gonorrhea. Ask your health care provider if you are at risk.  If you do not have HIV, but are at risk, it may be recommended that you take a prescription medicine daily to prevent HIV infection. This is called pre-exposure prophylaxis (PrEP). You are considered at risk if:  You are sexually active and do not regularly use condoms or know the HIV status of your partner(s).  You take drugs by injection.  You are sexually  active with a partner who has HIV. Talk with your health care provider about whether you are at high risk of being infected with HIV. If you choose to begin PrEP, you should first be tested for HIV. You should then be tested every 3 months for as long as you are taking PrEP.  PREGNANCY   If you are premenopausal and you may become pregnant, ask your health care provider about preconception counseling.  If you may  become pregnant, take 400 to 800 micrograms (mcg) of folic acid every day.  If you want to prevent pregnancy, talk to your health care provider about birth control (contraception). OSTEOPOROSIS AND MENOPAUSE   Osteoporosis is a disease in which the bones lose minerals and strength with aging. This can result in serious bone fractures. Your risk for osteoporosis can be identified using a bone density scan.  If you are 61 years of age or older, or if you are at risk for osteoporosis and fractures, ask your health care provider if you should be screened.  Ask your health care provider whether you should take a calcium or vitamin D supplement to lower your risk for osteoporosis.  Menopause may have certain physical symptoms and risks.  Hormone replacement therapy may reduce some of these symptoms and risks. Talk to your health care provider about whether hormone replacement therapy is right for you.  HOME CARE INSTRUCTIONS   Schedule regular health, dental, and eye exams.  Stay current with your immunizations.   Do not use any tobacco products including cigarettes, chewing tobacco, or electronic cigarettes.  If you are pregnant, do not drink alcohol.  If you are breastfeeding, limit how much and how often you drink alcohol.  Limit alcohol intake to no more than 1 drink per day for nonpregnant women. One drink equals 12 ounces of beer, 5 ounces of wine, or 1 ounces of hard liquor.  Do not use street drugs.  Do not share needles.  Ask your health care provider for help if  you need support or information about quitting drugs.  Tell your health care provider if you often feel depressed.  Tell your health care provider if you have ever been abused or do not feel safe at home.   This information is not intended to replace advice given to you by your health care provider. Make sure you discuss any questions you have with your health care provider.   Document Released: 10/01/2010 Document Revised: 04/08/2014 Document Reviewed: 02/17/2013 Elsevier Interactive Patient Education [employer].

## 2015-09-20 NOTE — Progress Notes (Signed)
Subjective:    Patient ID: Lindsay ButteryAlicia J Collins, female    DOB: 08-Nov-1985, 30 y.o.   MRN: [id number]  HPI  Pt presents to the clinic today for her annual exam.  Flu: 12/2014 Tetanus: 12/2011 Pap Smear: 2015, hysterectomy d/t CIN 2 Dentist: biannually  Diet: She does eat meat. Fruits and veggies daily. She does consume some fried foods. She drinks tea, soda and water. Exercise: None   Review of Systems      Past Medical History  Diagnosis Date  . Nephrolithiasis     Current Outpatient Prescriptions  Medication Sig Dispense Refill  . butalbital-acetaminophen-caffeine (FIORICET, ESGIC) 50-325-40 MG tablet Take 1 tablet by mouth every 6 (six) hours as needed for headache. 30 tablet 1  . HYDROcodone-acetaminophen (NORCO/VICODIN) 5-325 MG tablet Take 1 tablet by mouth every 6 (six) hours as needed for moderate pain. 20 tablet 0   No current facility-administered medications for this visit.    No Known Allergies  Family History  Problem Relation Age of Onset  . COPD Mother   . COPD Father   . Cancer Father     prostate    Social History   Social History  . Marital Status: Single    Spouse Name: N/A  . Number of Children: N/A  . Years of Education: N/A   Occupational History  . Not on file.   Social History Main Topics  . Smoking status: Never Smoker   . Smokeless tobacco: Not on file  . Alcohol Use: No  . Drug Use: No  . Sexual Activity: Yes   Other Topics Concern  . Not on file   Social History Narrative     Constitutional: Denies fever, malaise, fatigue, headache or abrupt weight changes.  HEENT: Denies eye pain, eye redness, ear pain, ringing in the ears, wax buildup, runny nose, nasal congestion, bloody nose, or sore throat. Respiratory: Denies difficulty breathing, shortness of breath, cough or sputum production.   Cardiovascular: Denies chest pain, chest tightness, palpitations or swelling in the hands or feet.  Gastrointestinal: Denies abdominal  pain, bloating, constipation, diarrhea or blood in the stool.  GU: Denies urgency, frequency, pain with urination, burning sensation, blood in urine, odor or discharge. Musculoskeletal: Denies decrease in range of motion, difficulty with gait, muscle pain or joint pain and swelling.  Skin: Denies redness, rashes, lesions or ulcercations.  Neurological: Denies dizziness, difficulty with memory, difficulty with speech or problems with balance and coordination.  Psych: Denies anxiety, depression, SI/HI.  No other specific complaints in a complete review of systems (except as listed in HPI above).  Objective:   Physical Exam   BP 108/76 mmHg  Pulse 66  Temp(Src) 98.9 F (37.2 C) (Oral)  Ht 5\' 4"  (1.626 m)  Wt 139 lb (63.05 kg)  BMI 23.85 kg/m2  SpO2 99% Wt Readings from Last 3 Encounters:  09/20/15 139 lb (63.05 kg)  08/14/15 135 lb 8 oz (61.462 kg)  06/20/14 141 lb (63.957 kg)    General: Appears her stated age, well developed, well nourished in NAD. Skin: Warm, dry and intact. No rashes, lesions or ulcerations noted. HEENT: Head: normal shape and size; Eyes: sclera white, no icterus, conjunctiva pink, PERRLA and EOMs intact; Ears: Tm's gray and intact, normal light reflex;Throat/Mouth: Teeth present, mucosa pink and moist, no exudate, lesions or ulcerations noted.  Neck:  Neck supple, trachea midline. No masses, lumps or thyromegaly present.  Cardiovascular: Normal rate and rhythm. S1,S2 noted.  No murmur, rubs or gallops  noted. No JVD or BLE edema. . Pulmonary/Chest: Normal effort and positive vesicular breath sounds. No respiratory distress. No wheezes, rales or ronchi noted.  Abdomen: Soft and nontender. Normal bowel sounds. No distention or masses noted. Liver, spleen and kidneys non palpable. Musculoskeletal: Strength 5/5 BUE/BLE. No difficulty with gait.  Neurological: Alert and oriented. Cranial nerves II-XII grossly intact. Coordination normal.  Psychiatric: Mood and affect  normal. Behavior is normal. Judgment and thought content normal.     BMET    Component Value Date/Time   NA 138 03/17/2015 0911   NA 138 09/23/2013 0536   K 4.6 03/17/2015 0911   K 3.9 09/23/2013 0536   CL 103 03/17/2015 0911   CL 105 09/23/2013 0536   CO2 29 03/17/2015 0911   CO2 29 09/23/2013 0536   GLUCOSE 107* 03/17/2015 0911   GLUCOSE 131* 09/23/2013 0536   BUN 13 03/17/2015 0911   BUN 13 09/23/2013 0536   CREATININE 0.92 03/17/2015 0911   CREATININE 0.83 09/23/2013 0536   CALCIUM 9.5 03/17/2015 0911   CALCIUM 8.5 09/23/2013 0536   GFRNONAA >60 09/23/2013 0536   GFRAA >60 09/23/2013 0536    Lipid Panel     Component Value Date/Time   CHOL 156 03/17/2015 0911   TRIG 36.0 03/17/2015 0911   HDL 69.70 03/17/2015 0911   CHOLHDL 2 03/17/2015 0911   VLDL 7.2 03/17/2015 0911   LDLCALC 79 03/17/2015 0911    CBC    Component Value Date/Time   WBC 4.4 03/17/2015 0911   WBC 4.7 09/23/2013 0536   RBC 4.21 03/17/2015 0911   RBC 3.08* 09/23/2013 0536   HGB 13.3 03/17/2015 0911   HGB 10.0* 09/23/2013 0536   HCT 39.1 03/17/2015 0911   HCT 28.8* 09/23/2013 0536   PLT 211.0 03/17/2015 0911   PLT 138* 09/23/2013 0536   MCV 92.8 03/17/2015 0911   MCV 93 09/23/2013 0536   MCH 32.5 09/23/2013 0536   MCHC 34.0 03/17/2015 0911   MCHC 34.8 09/23/2013 0536   RDW 13.0 03/17/2015 0911   RDW 13.6 09/23/2013 0536   LYMPHSABS 1.4 06/11/2013 0939   LYMPHSABS 1.9 02/25/2012 1424   MONOABS 0.3 06/11/2013 0939   MONOABS 0.4 02/25/2012 1424   EOSABS 0.0 06/11/2013 0939   EOSABS 0.1 02/25/2012 1424   BASOSABS 0.0 06/11/2013 0939   BASOSABS 0.0 02/25/2012 1424    Hgb A1C No results found for: HGBA1C      Assessment & Plan:   Preventative Health Maintenance:  Encouraged her to get a flu shot in the fall Tetanus UTD Pap smear today, will need annually Encouraged her to consume a balanced diet and start an exercise regimen Encouraged her to see a dentist at least  annually Will check CBC, CMET and Lipid Profile today Will check HIV and RPR today  RTC in 1 year for annual exam

## 2015-09-21 LAB — RPR

## 2015-09-21 LAB — HIV ANTIBODY (ROUTINE TESTING W REFLEX): HIV 1&2 Ab, 4th Generation: NONREACTIVE

## 2015-09-22 LAB — CYTOLOGY - PAP

## 2015-09-25 ENCOUNTER — Other Ambulatory Visit: Payer: Self-pay | Admitting: Internal Medicine

## 2015-09-25 LAB — CERVICOVAGINAL ANCILLARY ONLY
BACTERIAL VAGINITIS: NEGATIVE
CANDIDA VAGINITIS: NEGATIVE

## 2015-09-25 MED ORDER — AZITHROMYCIN 500 MG PO TABS: 1000.0000 mg | ORAL_TABLET | Freq: Once | ORAL | Status: DC

## 2015-10-05 ENCOUNTER — Telehealth: Payer: Self-pay

## 2015-10-05 NOTE — Telephone Encounter (Signed)
Pt called to report that after her Dx of chlamydia, she completed dose of zithromax on 09/26/2015. Pt states that she unprotected sex on 09/28/2015 and wants to know if she needs to have another dose or what she should do as she will be going out of town. I called Nicki Reaperegina Baity and asked what would she advise pt to do as she cannot be retested until after 4 weeks. Per verbal order---pt does not need additional dose for now and should practice safe (protected) sex until she can be retested if she wants to be rechecked. Pt is aware and expressed understanding

## 2015-11-08 ENCOUNTER — Other Ambulatory Visit: Payer: Self-pay | Admitting: Internal Medicine

## 2015-11-08 DIAGNOSIS — N644 Mastodynia: Secondary | ICD-10-CM

## 2015-11-22 ENCOUNTER — Ambulatory Visit
Admission: RE | Admit: 2015-11-22 | Discharge: 2015-11-22 | Disposition: A | Discharge status: Home / Self Care | Payer: Commercial Managed Care - PPO | Source: Ambulatory Visit | Attending: Internal Medicine | Admitting: Internal Medicine

## 2015-11-22 ENCOUNTER — Other Ambulatory Visit: Payer: Self-pay | Admitting: Internal Medicine

## 2015-11-22 DIAGNOSIS — Z803 Family history of malignant neoplasm of breast: Secondary | ICD-10-CM | POA: Insufficient documentation

## 2015-11-22 DIAGNOSIS — N644 Mastodynia: Secondary | ICD-10-CM

## 2015-12-27 ENCOUNTER — Other Ambulatory Visit: Payer: Self-pay | Admitting: Internal Medicine

## 2015-12-27 MED ORDER — AZITHROMYCIN 500 MG PO TABS: 2000.0000 mg | ORAL_TABLET | Freq: Once | ORAL | 0 refills | Status: AC

## 2016-01-05 ENCOUNTER — Other Ambulatory Visit: Payer: Self-pay | Admitting: Internal Medicine

## 2016-01-05 MED ORDER — ALPRAZOLAM 0.5 MG PO TABS: 0.5000 mg | ORAL_TABLET | Freq: Every day | ORAL | 0 refills | Status: DC | PRN

## 2016-01-30 ENCOUNTER — Other Ambulatory Visit: Payer: Self-pay | Admitting: Internal Medicine

## 2016-01-30 MED ORDER — HYDROCODONE-HOMATROPINE 5-1.5 MG/5ML PO SYRP: 5.0000 mL | ORAL_SOLUTION | Freq: Three times a day (TID) | ORAL | 0 refills | Status: DC | PRN

## 2016-02-01 ENCOUNTER — Ambulatory Visit (INDEPENDENT_AMBULATORY_CARE_PROVIDER_SITE_OTHER): Payer: Commercial Managed Care - PPO | Admitting: Internal Medicine

## 2016-02-01 ENCOUNTER — Encounter: Payer: Self-pay | Admitting: Internal Medicine

## 2016-02-01 VITALS — BP 106/68 | HR 77 | Temp 98.8°F | Wt 136.2 lb

## 2016-02-01 DIAGNOSIS — L298 Other pruritus: Secondary | ICD-10-CM | POA: Diagnosis not present

## 2016-02-01 DIAGNOSIS — B373 Candidiasis of vulva and vagina: Secondary | ICD-10-CM

## 2016-02-01 DIAGNOSIS — B9689 Other specified bacterial agents as the cause of diseases classified elsewhere: Secondary | ICD-10-CM

## 2016-02-01 DIAGNOSIS — Z113 Encounter for screening for infections with a predominantly sexual mode of transmission: Secondary | ICD-10-CM

## 2016-02-01 DIAGNOSIS — N898 Other specified noninflammatory disorders of vagina: Secondary | ICD-10-CM | POA: Diagnosis not present

## 2016-02-01 DIAGNOSIS — B3731 Acute candidiasis of vulva and vagina: Secondary | ICD-10-CM

## 2016-02-01 DIAGNOSIS — N760 Acute vaginitis: Secondary | ICD-10-CM

## 2016-02-01 MED ORDER — FLUCONAZOLE 150 MG PO TABS: 150.0000 mg | ORAL_TABLET | Freq: Once | ORAL | 0 refills | Status: AC

## 2016-02-01 MED ORDER — METRONIDAZOLE 500 MG PO TABS: 500.0000 mg | ORAL_TABLET | Freq: Two times a day (BID) | ORAL | 0 refills | Status: DC

## 2016-02-01 MED ORDER — FLUCONAZOLE 150 MG PO TABS: 150.0000 mg | ORAL_TABLET | Freq: Once | ORAL | 0 refills | Status: DC

## 2016-02-01 NOTE — Progress Notes (Signed)
Subjective:    Patient ID: Lindsay Collins, female    DOB: June 21, 1985, 30 y.o.   MRN: [id number]  HPI  Pt presents to the clinic today for STD recheck. She was treated for chlamydia 1 month ago with Azithromyin. She took the medication as prescribed. She reports she has noticed a thin white vaginal discharge without any odor. She denies pelvic pain, fever or chills. Her last pap was done prior to her last hysterectomy. She has not taken anything OTC for this. She is sexually active with 1 partner.  Review of Systems      Past Medical History:  Diagnosis Date  . Nephrolithiasis     Current Outpatient Prescriptions  Medication Sig Dispense Refill  . ALPRAZolam (XANAX) 0.5 MG tablet Take 1 tablet (0.5 mg total) by mouth daily as needed for anxiety. 30 tablet 0  . butalbital-acetaminophen-caffeine (FIORICET, ESGIC) 50-325-40 MG tablet Take 1 tablet by mouth every 6 (six) hours as needed for headache. 30 tablet 1  . HYDROcodone-homatropine (HYCODAN) 5-1.5 MG/5ML syrup Take 5 mLs by mouth every 8 (eight) hours as needed for cough. 120 mL 0   No current facility-administered medications for this visit.     No Known Allergies  Family History  Problem Relation Age of Onset  . COPD Mother   . Breast cancer Mother 3130  . COPD Father   . Cancer Father     prostate    Social History   Social History  . Marital status: Single    Spouse name: N/A  . Number of children: N/A  . Years of education: N/A   Occupational History  . Not on file.   Social History Main Topics  . Smoking status: Never Smoker  . Smokeless tobacco: Not on file  . Alcohol use No  . Drug use: No  . Sexual activity: Yes   Other Topics Concern  . Not on file   Social History Narrative  . No narrative on file     Constitutional: Denies fever, malaise, fatigue, headache or abrupt weight changes.  Gastrointestinal: Denies abdominal pain, bloating, constipation, diarrhea or blood in the stool.  GU: Pt  reports vaginal discharge. Denies urgency, frequency, pain with urination, burning sensation, blood in urine, odor.   No other specific complaints in a complete review of systems (except as listed in HPI above).  Objective:   Physical Exam   BP 106/68   Pulse 77   Temp 98.8 F (37.1 C) (Oral)   Wt 136 lb 4 oz (61.8 kg)   SpO2 98%   BMI 23.39 kg/m  Wt Readings from Last 3 Encounters:  02/01/16 136 lb 4 oz (61.8 kg)  09/20/15 139 lb (63 kg)  08/14/15 135 lb 8 oz (61.5 kg)    General: Appears ther stated age, well developed, well nourished in NAD. Abdomen: Soft and nontender. Normal bowel sounds. No distention or masses noted.  Pelvic: deferred. She did a self swab wet prep and gave urine for g/c testing.  BMET    Component Value Date/Time   NA 137 09/20/2015 1502   NA 138 09/23/2013 0536   K 4.2 09/20/2015 1502   K 3.9 09/23/2013 0536   CL 103 09/20/2015 1502   CL 105 09/23/2013 0536   CO2 29 09/20/2015 1502   CO2 29 09/23/2013 0536   GLUCOSE 93 09/20/2015 1502   GLUCOSE 131 (H) 09/23/2013 0536   BUN 14 09/20/2015 1502   BUN 13 09/23/2013 0536   CREATININE  1.13 09/20/2015 1502   CREATININE 0.83 09/23/2013 0536   CALCIUM 9.6 09/20/2015 1502   CALCIUM 8.5 09/23/2013 0536   GFRNONAA >60 09/23/2013 0536   GFRAA >60 09/23/2013 0536    Lipid Panel     Component Value Date/Time   CHOL 151 09/20/2015 1502   TRIG 82.0 09/20/2015 1502   HDL 67.10 09/20/2015 1502   CHOLHDL 2 09/20/2015 1502   VLDL 16.4 09/20/2015 1502   LDLCALC 67 09/20/2015 1502    CBC    Component Value Date/Time   WBC 5.7 09/20/2015 1502   RBC 3.89 09/20/2015 1502   HGB 12.4 09/20/2015 1502   HGB 10.0 (L) 09/23/2013 0536   HCT 36.1 09/20/2015 1502   HCT 28.8 (L) 09/23/2013 0536   PLT 203.0 09/20/2015 1502   PLT 138 (L) 09/23/2013 0536   MCV 92.7 09/20/2015 1502   MCV 93 09/23/2013 0536   MCH 32.5 09/23/2013 0536   MCHC 34.3 09/20/2015 1502   RDW 13.7 09/20/2015 1502   RDW 13.6  09/23/2013 0536   LYMPHSABS 1.4 06/11/2013 0939   LYMPHSABS 1.9 02/25/2012 1424   MONOABS 0.3 06/11/2013 0939   MONOABS 0.4 02/25/2012 1424   EOSABS 0.0 06/11/2013 0939   EOSABS 0.1 02/25/2012 1424   BASOSABS 0.0 06/11/2013 0939   BASOSABS 0.0 02/25/2012 1424    Hgb A1C No results found for: HGBA1C       Assessment & Plan:   Vaginal discharge and itching:  Wet prep: + yeast and clue cells eRx for Flagyl 500 mg BID x 5 days eRx for Diflucan 150 mg PO today, repeat in 5 days  Screen for STD:  Urine gonorrhea and chlamydia today  RTC as needed or if symptoms persist or worsen BAITY, REGINA, NP

## 2016-02-01 NOTE — Addendum Note (Signed)
Addended by: Roena MaladyEVONTENNO, MELANIE Y on: 02/01/2016 04:05 PM   Modules accepted: Orders

## 2016-02-01 NOTE — Patient Instructions (Signed)
Sexually Transmitted Disease °A sexually transmitted disease (STD) is a disease or infection that may be passed (transmitted) from person to person, usually during sexual activity. This may happen by way of saliva, semen, blood, vaginal mucus, or urine. Common STDs include: °· Gonorrhea. °· Chlamydia. °· Syphilis. °· HIV and AIDS. °· Genital herpes. °· Hepatitis B and C. °· Trichomonas. °· Human papillomavirus (HPV). °· Pubic lice. °· Scabies. °· Mites. °· Bacterial vaginosis. °WHAT ARE CAUSES OF STDs? °An STD may be caused by bacteria, a virus, or parasites. STDs are often transmitted during sexual activity if one person is infected. However, they may also be transmitted through nonsexual means. STDs may be transmitted after:  °· Sexual intercourse with an infected person. °· Sharing sex toys with an infected person. °· Sharing needles with an infected person or using unclean piercing or tattoo needles. °· Having intimate contact with the genitals, mouth, or rectal areas of an infected person. °· Exposure to infected fluids during birth. °WHAT ARE THE SIGNS AND SYMPTOMS OF STDs? °Different STDs have different symptoms. Some people may not have any symptoms. If symptoms are present, they may include: °· Painful or bloody urination. °· Pain in the pelvis, abdomen, vagina, anus, throat, or eyes. °· A skin rash, itching, or irritation. °· Growths, ulcerations, blisters, or sores in the genital and anal areas. °· Abnormal vaginal discharge with or without bad odor. °· Penile discharge in men. °· Fever. °· Pain or bleeding during sexual intercourse. °· Swollen glands in the groin area. °· Yellow skin and eyes (jaundice). This is seen with hepatitis. °· Swollen testicles. °· Infertility. °· Sores and blisters in the mouth. °HOW ARE STDs DIAGNOSED? °To make a diagnosis, your health care provider may: °· Take a medical history. °· Perform a physical exam. °· Take a sample of any discharge to examine. °· Swab the throat,  cervix, opening to the penis, rectum, or vagina for testing. °· Test a sample of your first morning urine. °· Perform blood tests. °· Perform a Pap test, if this applies. °· Perform a colposcopy. °· Perform a laparoscopy. °HOW ARE STDs TREATED? °Treatment depends on the STD. Some STDs may be treated but not cured. °· Chlamydia, gonorrhea, trichomonas, and syphilis can be cured with antibiotic medicine. °· Genital herpes, hepatitis, and HIV can be treated, but not cured, with prescribed medicines. The medicines lessen symptoms. °· Genital warts from HPV can be treated with medicine or by freezing, burning (electrocautery), or surgery. Warts may come back. °· HPV cannot be cured with medicine or surgery. However, abnormal areas may be removed from the cervix, vagina, or vulva. °· If your diagnosis is confirmed, your recent sexual partners need treatment. This is true even if they are symptom-free or have a negative culture or evaluation. They should not have sex until their health care providers say it is okay. °· Your health care provider may test you for infection again 3 months after treatment. °HOW CAN I REDUCE MY RISK OF GETTING AN STD? °Take these steps to reduce your risk of getting an STD: °· Use latex condoms, dental dams, and water-soluble lubricants during sexual activity. Do not use petroleum jelly or oils. °· Avoid having multiple sex partners. °· Do not have sex with someone who has other sex partners °· Do not have sex with anyone you do not know or who is at high risk for an STD. °· Avoid risky sex practices that can break your skin. °· Do not have sex   if you have open sores on your mouth or skin. °· Avoid drinking too much alcohol or taking illegal drugs. Alcohol and drugs can affect your judgment and put you in a vulnerable position. °· Avoid engaging in oral and anal sex acts. °· Get vaccinated for HPV and hepatitis. If you have not received these vaccines in the past, talk to your health care  provider about whether one or both might be right for you. °· If you are at risk of being infected with HIV, it is recommended that you take a prescription medicine daily to prevent HIV infection. This is called pre-exposure prophylaxis (PrEP). You are considered at risk if: °¨ You are a man who has sex with other men (MSM). °¨ You are a heterosexual man or woman and are sexually active with more than one partner. °¨ You take drugs by injection. °¨ You are sexually active with a partner who has HIV. °· Talk with your health care provider about whether you are at high risk of being infected with HIV. If you choose to begin PrEP, you should first be tested for HIV. You should then be tested every 3 months for as long as you are taking PrEP. °WHAT SHOULD I DO IF I THINK I HAVE AN STD? °· See your health care provider. °· Tell your sexual partner(s). They should be tested and treated for any STDs. °· Do not have sex until your health care provider says it is okay. °WHEN SHOULD I GET IMMEDIATE MEDICAL CARE? °Contact your health care provider right away if:  °· You have severe abdominal pain. °· You are a man and notice swelling or pain in your testicles. °· You are a woman and notice swelling or pain in your vagina. °  °This information is not intended to replace advice given to you by your health care provider. Make sure you discuss any questions you have with your health care provider. °  °Document Released: 06/08/2002 Document Revised: 04/08/2014 Document Reviewed: 10/06/2012 °Elsevier Interactive Patient Education ©2016 [employer] ° °

## 2016-02-02 LAB — GC/CHLAMYDIA PROBE AMP
CT Probe RNA: NOT DETECTED
GC PROBE AMP APTIMA: NOT DETECTED

## 2016-02-08 ENCOUNTER — Other Ambulatory Visit: Payer: Self-pay | Admitting: Internal Medicine

## 2016-02-08 MED ORDER — HYDROCODONE-HOMATROPINE 5-1.5 MG/5ML PO SYRP: 5.0000 mL | ORAL_SOLUTION | Freq: Three times a day (TID) | ORAL | 0 refills | Status: DC | PRN

## 2016-03-11 ENCOUNTER — Other Ambulatory Visit: Payer: Self-pay | Admitting: Internal Medicine

## 2016-03-11 MED ORDER — CEPHALEXIN 500 MG PO CAPS: 500.0000 mg | ORAL_CAPSULE | Freq: Two times a day (BID) | ORAL | 0 refills | Status: DC

## 2016-03-15 ENCOUNTER — Other Ambulatory Visit: Payer: Self-pay | Admitting: Internal Medicine

## 2016-03-15 MED ORDER — CLOBETASOL PROPIONATE 0.05 % EX CREA: 1.0000 "application " | TOPICAL_CREAM | Freq: Two times a day (BID) | CUTANEOUS | 0 refills | Status: DC

## 2016-03-19 ENCOUNTER — Other Ambulatory Visit: Payer: Self-pay | Admitting: Internal Medicine

## 2016-03-19 MED ORDER — HYDROCODONE-HOMATROPINE 5-1.5 MG/5ML PO SYRP: 5.0000 mL | ORAL_SOLUTION | Freq: Three times a day (TID) | ORAL | 0 refills | Status: DC | PRN

## 2016-03-19 MED ORDER — BUTALBITAL-APAP-CAFFEINE 50-325-40 MG PO TABS: 1.0000 | ORAL_TABLET | Freq: Four times a day (QID) | ORAL | 1 refills | Status: DC | PRN

## 2016-04-05 ENCOUNTER — Other Ambulatory Visit: Payer: Self-pay

## 2016-04-05 MED ORDER — ALPRAZOLAM 0.5 MG PO TABS: 0.5000 mg | ORAL_TABLET | Freq: Every day | ORAL | 0 refills | Status: DC | PRN

## 2016-04-05 NOTE — Telephone Encounter (Signed)
[facility]

## 2016-07-15 ENCOUNTER — Other Ambulatory Visit: Payer: Self-pay | Admitting: Internal Medicine

## 2016-07-15 MED ORDER — CLOBETASOL PROPIONATE 0.05 % EX CREA: 1.0000 "application " | TOPICAL_CREAM | Freq: Two times a day (BID) | CUTANEOUS | 0 refills | Status: DC

## 2016-08-19 ENCOUNTER — Other Ambulatory Visit: Payer: Self-pay

## 2016-08-19 MED ORDER — ALPRAZOLAM 0.5 MG PO TABS: 0.5000 mg | ORAL_TABLET | Freq: Every day | ORAL | 0 refills | Status: DC | PRN

## 2016-08-19 NOTE — Telephone Encounter (Signed)
[facility]

## 2016-09-24 ENCOUNTER — Ambulatory Visit (INDEPENDENT_AMBULATORY_CARE_PROVIDER_SITE_OTHER): Payer: BLUE CROSS/BLUE SHIELD | Admitting: Internal Medicine

## 2016-09-24 ENCOUNTER — Encounter: Payer: Self-pay | Admitting: Internal Medicine

## 2016-09-24 ENCOUNTER — Other Ambulatory Visit (HOSPITAL_COMMUNITY)
Admission: RE | Admit: 2016-09-24 | Discharge: 2016-09-24 | Disposition: A | Discharge status: Home / Self Care | Payer: BLUE CROSS/BLUE SHIELD | Source: Ambulatory Visit | Attending: Internal Medicine | Admitting: Internal Medicine

## 2016-09-24 VITALS — BP 104/66 | HR 62 | Temp 98.0°F | Ht 64.0 in | Wt 135.5 lb

## 2016-09-24 DIAGNOSIS — Z0000 Encounter for general adult medical examination without abnormal findings: Secondary | ICD-10-CM | POA: Diagnosis not present

## 2016-09-24 DIAGNOSIS — R5383 Other fatigue: Secondary | ICD-10-CM

## 2016-09-24 DIAGNOSIS — Z113 Encounter for screening for infections with a predominantly sexual mode of transmission: Secondary | ICD-10-CM | POA: Insufficient documentation

## 2016-09-24 DIAGNOSIS — R87810 Cervical high risk human papillomavirus (HPV) DNA test positive: Secondary | ICD-10-CM | POA: Diagnosis not present

## 2016-09-24 DIAGNOSIS — R51 Headache: Secondary | ICD-10-CM | POA: Diagnosis not present

## 2016-09-24 DIAGNOSIS — R519 Headache, unspecified: Secondary | ICD-10-CM | POA: Insufficient documentation

## 2016-09-24 DIAGNOSIS — F419 Anxiety disorder, unspecified: Secondary | ICD-10-CM | POA: Diagnosis not present

## 2016-09-24 LAB — LIPID PANEL
CHOL/HDL RATIO: 2
Cholesterol: 157 mg/dL (ref 0–200)
HDL: 68.6 mg/dL (ref >39.00)
LDL Cholesterol: 77 mg/dL (ref 0–99)
NonHDL: 88.39
Triglycerides: 55 mg/dL (ref 0.0–149.0)
VLDL: 11 mg/dL (ref 0.0–40.0)

## 2016-09-24 LAB — CBC
HCT: 38.8 % (ref 36.0–46.0)
Hemoglobin: 13.5 g/dL (ref 12.0–15.0)
MCHC: 34.7 g/dL (ref 30.0–36.0)
MCV: 92.3 fl (ref 78.0–100.0)
Platelets: 215 10*3/uL (ref 150.0–400.0)
RBC: 4.2 Mil/uL (ref 3.87–5.11)
RDW: 13.5 % (ref 11.5–15.5)
WBC: 3.7 10*3/uL — AB (ref 4.0–10.5)

## 2016-09-24 LAB — COMPREHENSIVE METABOLIC PANEL
ALBUMIN: 4.8 g/dL (ref 3.5–5.2)
ALK PHOS: 49 U/L (ref 39–117)
ALT: 8 U/L (ref 0–35)
AST: 13 U/L (ref 0–37)
BILIRUBIN TOTAL: 0.7 mg/dL (ref 0.2–1.2)
BUN: 15 mg/dL (ref 6–23)
CO2: 26 mEq/L (ref 19–32)
CREATININE: 0.91 mg/dL (ref 0.40–1.20)
Calcium: 9.5 mg/dL (ref 8.4–10.5)
Chloride: 104 mEq/L (ref 96–112)
GFR: 76.38 mL/min (ref >60.00)
GLUCOSE: 108 mg/dL — AB (ref 70–99)
Potassium: 4.1 mEq/L (ref 3.5–5.1)
SODIUM: 138 meq/L (ref 135–145)
TOTAL PROTEIN: 6.9 g/dL (ref 6.0–8.3)

## 2016-09-24 LAB — VITAMIN B12: VITAMIN B 12: 228 pg/mL (ref 211–911)

## 2016-09-24 LAB — VITAMIN D 25 HYDROXY (VIT D DEFICIENCY, FRACTURES): VITD: 61.78 ng/mL (ref 30.00–100.00)

## 2016-09-24 LAB — TSH: TSH: 1 u[IU]/mL (ref 0.35–4.50)

## 2016-09-24 MED ORDER — SUMATRIPTAN SUCCINATE 25 MG PO TABS: 25.0000 mg | ORAL_TABLET | ORAL | 2 refills | Status: DC | PRN

## 2016-09-24 NOTE — Assessment & Plan Note (Signed)
Stop Fioricet eRx for Imitrex, sedation caution given

## 2016-09-24 NOTE — Assessment & Plan Note (Signed)
Continue Xanax prn 

## 2016-09-24 NOTE — Progress Notes (Signed)
Subjective:    Patient ID: Lindsay Collins, female    DOB: 1985/10/14, 31 y.o.   MRN: [id number]  HPI  Pt presents to the clinic today for her annual exam.  Frequent Headaches: These occur about 3 x week. She is not sure what is triggering them. She reports associated nausea but no vomiting. She takes the Fioricet but reports it no longer helps.  Anxiety: Triggered by life stress, single motherhood. She is taking Xanax as needed, about 3-4 times per month.  Flu: 12/2015 Tetanus: 12/2011 Pap Smear: 08/2015, partial hysterectomy Dentist: biannually  Diet: She does eat meat. She consumes fruits and veggies daily. She occasionally eats fried foods. She drinks mostly water, some Mt. Dew. Exercise: She walks the dogs every other day.  Review of Systems      Past Medical History:  Diagnosis Date  . Nephrolithiasis     Current Outpatient Prescriptions  Medication Sig Dispense Refill  . ALPRAZolam (XANAX) 0.5 MG tablet Take 1 tablet (0.5 mg total) by mouth daily as needed for anxiety. 30 tablet 0  . butalbital-acetaminophen-caffeine (FIORICET, ESGIC) 50-325-40 MG tablet Take 1 tablet by mouth every 6 (six) hours as needed for headache. 30 tablet 1  . clobetasol cream (TEMOVATE) 0.05 % Apply 1 application topically 2 (two) times daily. 30 g 0  . HYDROcodone-homatropine (HYCODAN) 5-1.5 MG/5ML syrup Take 5 mLs by mouth every 8 (eight) hours as needed for cough. 120 mL 0   No current facility-administered medications for this visit.     No Known Allergies  Family History  Problem Relation Age of Onset  . COPD Mother   . Breast cancer Mother 55  . COPD Father   . Cancer Father        prostate    Social History   Social History  . Marital status: Single    Spouse name: N/A  . Number of children: N/A  . Years of education: N/A   Occupational History  . Not on file.   Social History Main Topics  . Smoking status: Never Smoker  . Smokeless tobacco: Never Used  . Alcohol  use No  . Drug use: No  . Sexual activity: Yes   Other Topics Concern  . Not on file   Social History Narrative  . No narrative on file     Constitutional: Pt reports headaches and fatigue. Denies fever, malaise, or abrupt weight changes.  HEENT: Denies eye pain, eye redness, ear pain, ringing in the ears, wax buildup, runny nose, nasal congestion, bloody nose, or sore throat. Respiratory: Denies difficulty breathing, shortness of breath, cough or sputum production.   Cardiovascular: Denies chest pain, chest tightness, palpitations or swelling in the hands or feet.  Gastrointestinal: Denies abdominal pain, bloating, constipation, diarrhea or blood in the stool.  GU: Denies urgency, frequency, pain with urination, burning sensation, blood in urine, odor or discharge. Musculoskeletal: Denies decrease in range of motion, difficulty with gait, muscle pain or joint pain and swelling.  Skin: Denies redness, rashes, lesions or ulcercations.  Neurological: Denies dizziness, difficulty with memory, difficulty with speech or problems with balance and coordination.  Psych: Pt reports anxiety. Denies depression, SI/HI.  No other specific complaints in a complete review of systems (except as listed in HPI above).  Objective:   Physical Exam  BP 104/66   Pulse 62   Temp 98 F (36.7 C) (Oral)   Ht 5\' 4"  (1.626 m)   Wt 135 lb 8 oz (61.5 kg)  SpO2 98%   BMI 23.26 kg/m  Wt Readings from Last 3 Encounters:  09/24/16 135 lb 8 oz (61.5 kg)  02/01/16 136 lb 4 oz (61.8 kg)  09/20/15 139 lb (63 kg)    General: Appears her stated age, well developed, well nourished in NAD. Skin: Warm, dry and intact. HEENT: Head: normal shape and size; Eyes: sclera white, no icterus, conjunctiva pink, PERRLA and EOMs intact; Ears: Tm's gray and intact, normal light reflex; Throat/Mouth: Teeth present, mucosa pink and moist, no exudate, lesions or ulcerations noted.  Neck:  Neck supple, trachea midline. No  masses, lumps or thyromegaly present.  Cardiovascular: Normal rate and rhythm. S1,S2 noted.  No murmur, rubs or gallops noted. No JVD or BLE edema.  Pulmonary/Chest: Normal effort and positive vesicular breath sounds. No respiratory distress. No wheezes, rales or ronchi noted.  Abdomen: Soft and nontender. Normal bowel sounds. No distention or masses noted. Liver, spleen and kidneys non palpable. Pelvic: Normal female anatomy. No discharge noted. Cervix absent. Adnexa palpable on the left, nonpalpable on the right.  Musculoskeletal: Strength 5/5 BUE/BLE. No difficulty with gait.  Neurological: Alert and oriented. Cranial nerves II-XII grossly intact. Coordination normal.  Psychiatric: Mood and affect normal. Behavior is normal. Judgment and thought content normal.    BMET    Component Value Date/Time   NA 137 09/20/2015 1502   NA 138 09/23/2013 0536   K 4.2 09/20/2015 1502   K 3.9 09/23/2013 0536   CL 103 09/20/2015 1502   CL 105 09/23/2013 0536   CO2 29 09/20/2015 1502   CO2 29 09/23/2013 0536   GLUCOSE 93 09/20/2015 1502   GLUCOSE 131 (H) 09/23/2013 0536   BUN 14 09/20/2015 1502   BUN 13 09/23/2013 0536   CREATININE 1.13 09/20/2015 1502   CREATININE 0.83 09/23/2013 0536   CALCIUM 9.6 09/20/2015 1502   CALCIUM 8.5 09/23/2013 0536   GFRNONAA >60 09/23/2013 0536   GFRAA >60 09/23/2013 0536    Lipid Panel     Component Value Date/Time   CHOL 151 09/20/2015 1502   TRIG 82.0 09/20/2015 1502   HDL 67.10 09/20/2015 1502   CHOLHDL 2 09/20/2015 1502   VLDL 16.4 09/20/2015 1502   LDLCALC 67 09/20/2015 1502    CBC    Component Value Date/Time   WBC 5.7 09/20/2015 1502   RBC 3.89 09/20/2015 1502   HGB 12.4 09/20/2015 1502   HGB 10.0 (L) 09/23/2013 0536   HCT 36.1 09/20/2015 1502   HCT 28.8 (L) 09/23/2013 0536   PLT 203.0 09/20/2015 1502   PLT 138 (L) 09/23/2013 0536   MCV 92.7 09/20/2015 1502   MCV 93 09/23/2013 0536   MCH 32.5 09/23/2013 0536   MCHC 34.3 09/20/2015  1502   RDW 13.7 09/20/2015 1502   RDW 13.6 09/23/2013 0536   LYMPHSABS 1.4 06/11/2013 0939   LYMPHSABS 1.9 02/25/2012 1424   MONOABS 0.3 06/11/2013 0939   MONOABS 0.4 02/25/2012 1424   EOSABS 0.0 06/11/2013 0939   EOSABS 0.1 02/25/2012 1424   BASOSABS 0.0 06/11/2013 0939   BASOSABS 0.0 02/25/2012 1424    Hgb A1C No results found for: HGBA1C          Assessment & Plan:   Preventative Health Maintenance:  Encouraged her to get a flu shot in the fall Tetanus UTD Pap smear today, will add on STD screening Encouraged her to consume a balanced diet and exercise regimen Advised her to see an eye doctor and dentist annually Will check  CBC, CMET and Lipid profile today  Fatigue:  ? R/t emotional stress Will check TSH, B12 and Vit D  RTC in 1 year, sooner if needed Nicki Reaper, NP

## 2016-09-24 NOTE — Patient Instructions (Signed)

## 2016-09-27 LAB — CYTOLOGY - PAP
Bacterial vaginitis: NEGATIVE
CANDIDA VAGINITIS: NEGATIVE
Chlamydia: NEGATIVE
DIAGNOSIS: NEGATIVE
HPV 16/18/45 genotyping: NEGATIVE
HPV: DETECTED — AB
NEISSERIA GONORRHEA: NEGATIVE
Trichomonas: NEGATIVE

## 2016-10-23 ENCOUNTER — Other Ambulatory Visit: Payer: Self-pay | Admitting: Internal Medicine

## 2016-10-23 DIAGNOSIS — Z1231 Encounter for screening mammogram for malignant neoplasm of breast: Secondary | ICD-10-CM

## 2016-10-25 ENCOUNTER — Other Ambulatory Visit: Payer: Self-pay | Admitting: Internal Medicine

## 2016-10-25 ENCOUNTER — Other Ambulatory Visit: Payer: Self-pay

## 2016-10-25 MED ORDER — ONDANSETRON HCL 4 MG PO TABS: 4.0000 mg | ORAL_TABLET | Freq: Three times a day (TID) | ORAL | 0 refills | Status: DC | PRN

## 2016-10-25 MED ORDER — ALPRAZOLAM 0.5 MG PO TABS: 0.5000 mg | ORAL_TABLET | Freq: Every day | ORAL | 0 refills | Status: DC | PRN

## 2016-10-25 NOTE — Telephone Encounter (Signed)
[facility]  [facility] called into [facility]

## 2016-10-25 NOTE — Progress Notes (Signed)
Please phone in Xanax.

## 2016-11-01 NOTE — Telephone Encounter (Signed)
Patient notifies R. Baity, NP that medication (Xanax) has not been filled by the pharmacy and she requests a confirmation call.  I [facility] and he was able to confirm that alprazolam 0.5mg  #30 was filled on 10/25/16 and is waiting on patient in the "hold bin" for her to pick up.  I called patient and notified her of what I found out and she will go back to the pharmacy today to pick up and thanks us for our efforts.

## 2016-12-11 ENCOUNTER — Other Ambulatory Visit: Payer: Self-pay | Admitting: Internal Medicine

## 2016-12-11 MED ORDER — FLUCONAZOLE 150 MG PO TABS: 150.0000 mg | ORAL_TABLET | Freq: Once | ORAL | 0 refills | Status: AC

## 2017-01-06 ENCOUNTER — Other Ambulatory Visit: Payer: Self-pay | Admitting: Internal Medicine

## 2017-01-06 NOTE — Telephone Encounter (Signed)
Last refill 10/25/16 #30 Last office visit 09/24/16

## 2017-01-07 MED ORDER — ALPRAZOLAM 0.5 MG PO TABS: 0.5000 mg | ORAL_TABLET | Freq: Every day | ORAL | 0 refills | Status: DC | PRN

## 2017-01-07 NOTE — Telephone Encounter (Signed)
Rx called in to requested pharmacy 

## 2017-01-07 NOTE — Telephone Encounter (Signed)
Ok to phone in Xanax 

## 2017-03-01 ENCOUNTER — Other Ambulatory Visit: Payer: Self-pay | Admitting: Internal Medicine

## 2017-03-03 ENCOUNTER — Other Ambulatory Visit: Payer: Self-pay | Admitting: Internal Medicine

## 2017-03-03 MED ORDER — ALPRAZOLAM 0.5 MG PO TABS: 0.5000 mg | ORAL_TABLET | Freq: Every day | ORAL | 0 refills | Status: DC | PRN

## 2017-03-03 NOTE — Telephone Encounter (Signed)
Rx called in to requested pharmacy 

## 2017-03-03 NOTE — Telephone Encounter (Signed)
Last Rx 01/07/2017. Last OV 08/2016

## 2017-03-03 NOTE — Telephone Encounter (Signed)
Ok to phone in Xanax 

## 2017-03-04 ENCOUNTER — Ambulatory Visit
Admission: RE | Admit: 2017-03-04 | Discharge: 2017-03-04 | Disposition: A | Discharge status: Home / Self Care | Payer: BLUE CROSS/BLUE SHIELD | Source: Ambulatory Visit | Attending: Internal Medicine | Admitting: Internal Medicine

## 2017-03-04 DIAGNOSIS — Z803 Family history of malignant neoplasm of breast: Secondary | ICD-10-CM | POA: Diagnosis not present

## 2017-03-04 DIAGNOSIS — Z1231 Encounter for screening mammogram for malignant neoplasm of breast: Secondary | ICD-10-CM | POA: Insufficient documentation

## 2017-03-06 ENCOUNTER — Ambulatory Visit: Payer: BLUE CROSS/BLUE SHIELD | Admitting: Internal Medicine

## 2017-03-06 VITALS — BP 104/72 | HR 73 | Temp 98.4°F | Wt 134.0 lb

## 2017-03-06 DIAGNOSIS — M546 Pain in thoracic spine: Secondary | ICD-10-CM

## 2017-03-06 DIAGNOSIS — S46812A Strain of other muscles, fascia and tendons at shoulder and upper arm level, left arm, initial encounter: Secondary | ICD-10-CM | POA: Diagnosis not present

## 2017-03-06 MED ORDER — CYCLOBENZAPRINE HCL 5 MG PO TABS: 5.0000 mg | ORAL_TABLET | Freq: Three times a day (TID) | ORAL | 0 refills | Status: DC | PRN

## 2017-03-06 MED ORDER — PREDNISONE 10 MG PO TABS: ORAL_TABLET | ORAL | 0 refills | Status: DC

## 2017-03-06 NOTE — Patient Instructions (Signed)

## 2017-03-06 NOTE — Progress Notes (Signed)
Subjective:    Patient ID: Lindsay Collins, female    DOB: 16-Sep-1985, 31 y.o.   MRN: [id number]  HPI  Pt presents to the clinic today with c/o thoracic back pain and left shoulder pain. She reports about 4-6 weeks ago, she slipped walking up some steps. She caught herself with her left hand. Since that time, she reports sharp stabbing pain over her thoracic spine. The pain does not radiate. It is worse with movement. There does not seem to be a certain position that makes it feel better. She also reports pain over her left scapula. She describes that pain as burning and pinching. She does have some associated pain in the left side of her neck and numbness in her left arm. She denies arm weakness. She reports she has tried Lidocaine and Ibuprofen with minimal relief.  Review of Systems  Past Medical History:  Diagnosis Date  . Nephrolithiasis     Current Outpatient Medications  Medication Sig Dispense Refill  . ALPRAZolam (XANAX) 0.5 MG tablet Take 1 tablet (0.5 mg total) by mouth daily as needed for anxiety. 30 tablet 0  . ondansetron (ZOFRAN) 4 MG tablet Take 1 tablet (4 mg total) by mouth every 8 (eight) hours as needed. 20 tablet 0  . SUMAtriptan (IMITREX) 25 MG tablet TAKE 1 TAB BY MOUTH EVERY 2HRS AS NEEDED FOR MIRGRAINE MAY REPEAT IN 2HRS IF HEADACHE PERSISTS 10 tablet 2   No current facility-administered medications for this visit.     No Known Allergies  Family History  Problem Relation Age of Onset  . COPD Mother   . Breast cancer Mother 830  . COPD Father   . Cancer Father        prostate    Social History   Socioeconomic History  . Marital status: Single    Spouse name: Not on file  . Number of children: Not on file  . Years of education: Not on file  . Highest education level: Not on file  Social Needs  . Financial resource strain: Not on file  . Food insecurity - worry: Not on file  . Food insecurity - inability: Not on file  . Transportation needs -  medical: Not on file  . Transportation needs - non-medical: Not on file  Occupational History  . Not on file  Tobacco Use  . Smoking status: Never Smoker  . Smokeless tobacco: Never Used  Substance and Sexual Activity  . Alcohol use: No    Alcohol/week: 0.0 oz  . Drug use: No  . Sexual activity: Yes  Other Topics Concern  . Not on file  Social History Narrative  . Not on file     Constitutional: Denies fever, malaise, fatigue, headache or abrupt weight changes.  Musculoskeletal: Pt reports back pain and left shoulder pain. Denies decrease in range of motion, difficulty with gait, or joint swelling.  Neurological: Pt reports mild numbness in her left arm. Denies dizziness, difficulty with memory, difficulty with speech or problems with balance and coordination.    No other specific complaints in a complete review of systems (except as listed in HPI above).     Objective:   Physical Exam  BP 104/72   Pulse 73   Temp 98.4 F (36.9 C) (Oral)   Wt 134 lb (60.8 kg)   SpO2 98%   BMI 23.00 kg/m  Wt Readings from Last 3 Encounters:  03/06/17 134 lb (60.8 kg)  09/24/16 135 lb 8 oz (61.5 kg)  02/01/16 136 lb 4 oz (61.8 kg)    General: Appears her stated age, well developed, well nourished in NAD. Musculoskeletal: Normal flexion, extension and rotation of the spine. She has bony tenderness noted over the thoracic spine. She has pain with palpation of the left paracervical muscles. Normal internal and external rotation of the left shoulder. Normal abduction and adduction of the left shoulder. Negative drop can test. Pain with palpation of the trapezius just above the left scapula. Neurological: Alert and oriented. Sensation intact to BUE.  BMET    Component Value Date/Time   NA 138 09/24/2016 0917   NA 138 09/23/2013 0536   K 4.1 09/24/2016 0917   K 3.9 09/23/2013 0536   CL 104 09/24/2016 0917   CL 105 09/23/2013 0536   CO2 26 09/24/2016 0917   CO2 29 09/23/2013 0536    GLUCOSE 108 (H) 09/24/2016 0917   GLUCOSE 131 (H) 09/23/2013 0536   BUN 15 09/24/2016 0917   BUN 13 09/23/2013 0536   CREATININE 0.91 09/24/2016 0917   CREATININE 0.83 09/23/2013 0536   CALCIUM 9.5 09/24/2016 0917   CALCIUM 8.5 09/23/2013 0536   GFRNONAA >60 09/23/2013 0536   GFRAA >60 09/23/2013 0536    Lipid Panel     Component Value Date/Time   CHOL 157 09/24/2016 0917   TRIG 55.0 09/24/2016 0917   HDL 68.60 09/24/2016 0917   CHOLHDL 2 09/24/2016 0917   VLDL 11.0 09/24/2016 0917   LDLCALC 77 09/24/2016 0917    CBC    Component Value Date/Time   WBC 3.7 (L) 09/24/2016 0917   RBC 4.20 09/24/2016 0917   HGB 13.5 09/24/2016 0917   HGB 10.0 (L) 09/23/2013 0536   HCT 38.8 09/24/2016 0917   HCT 28.8 (L) 09/23/2013 0536   PLT 215.0 09/24/2016 0917   PLT 138 (L) 09/23/2013 0536   MCV 92.3 09/24/2016 0917   MCV 93 09/23/2013 0536   MCH 32.5 09/23/2013 0536   MCHC 34.7 09/24/2016 0917   RDW 13.5 09/24/2016 0917   RDW 13.6 09/23/2013 0536   LYMPHSABS 1.4 06/11/2013 0939   LYMPHSABS 1.9 02/25/2012 1424   MONOABS 0.3 06/11/2013 0939   MONOABS 0.4 02/25/2012 1424   EOSABS 0.0 06/11/2013 0939   EOSABS 0.1 02/25/2012 1424   BASOSABS 0.0 06/11/2013 0939   BASOSABS 0.0 02/25/2012 1424    Hgb A1C No results found for: HGBA1C          Assessment & Plan:   Thoracic Back Pain:  Will obtain xray of thoracic spine today eRx for Pred Taper eRx for Flexeril TID prn- sedation caution given Encouraged heat Stretching exercises given   Trapezius Muscle Strain:  eRx for Pred Taper eRx for Flexeril TID prn- sedation caution given Encouraged heat Stretching exercises given  Return precautions discussed Nicki ReaperBAITY, Fraidy Mccarrick, NP

## 2017-03-07 ENCOUNTER — Encounter: Payer: Self-pay | Admitting: Internal Medicine

## 2017-03-07 ENCOUNTER — Ambulatory Visit (INDEPENDENT_AMBULATORY_CARE_PROVIDER_SITE_OTHER)
Admission: RE | Admit: 2017-03-07 | Discharge: 2017-03-07 | Disposition: A | Discharge status: Home / Self Care | Payer: BLUE CROSS/BLUE SHIELD | Source: Ambulatory Visit | Attending: Internal Medicine | Admitting: Internal Medicine

## 2017-03-07 ENCOUNTER — Telehealth: Payer: Self-pay | Admitting: Internal Medicine

## 2017-03-07 DIAGNOSIS — M546 Pain in thoracic spine: Secondary | ICD-10-CM | POA: Diagnosis not present

## 2017-03-07 NOTE — Telephone Encounter (Signed)
Copied from CRM [id number]. Topic: Quick Communication - See Telephone Encounter >> Mar 07, 2017  1:34 PM Terisa Starraylor, Brittany L wrote: Patient would like to know what her results are from this am. Please call back at [phone].  03/07/17.

## 2017-03-07 NOTE — Telephone Encounter (Signed)
Message sent via mychart and it shows that pt was able to view the results

## 2017-03-07 NOTE — Telephone Encounter (Signed)
See result note.  

## 2017-04-03 ENCOUNTER — Other Ambulatory Visit: Payer: Self-pay | Admitting: Internal Medicine

## 2017-04-03 MED ORDER — PROMETHAZINE-DM 6.25-15 MG/5ML PO SYRP: 5.0000 mL | ORAL_SOLUTION | Freq: Four times a day (QID) | ORAL | 0 refills | Status: DC | PRN

## 2017-04-03 NOTE — Progress Notes (Signed)
promethazin

## 2017-04-28 ENCOUNTER — Other Ambulatory Visit: Payer: Self-pay | Admitting: Internal Medicine

## 2017-04-28 MED ORDER — ALPRAZOLAM 0.5 MG PO TABS: 0.5000 mg | ORAL_TABLET | Freq: Every day | ORAL | 0 refills | Status: DC | PRN

## 2017-04-28 NOTE — Telephone Encounter (Signed)
Last filled 03/03/17... Please advise

## 2017-04-29 ENCOUNTER — Other Ambulatory Visit: Payer: Self-pay | Admitting: Internal Medicine

## 2017-04-29 MED ORDER — AZITHROMYCIN 250 MG PO TABS: ORAL_TABLET | ORAL | 0 refills | Status: DC

## 2017-05-13 ENCOUNTER — Encounter: Payer: Self-pay | Admitting: Internal Medicine

## 2017-05-13 ENCOUNTER — Other Ambulatory Visit: Payer: Self-pay | Admitting: Internal Medicine

## 2017-05-13 NOTE — Telephone Encounter (Signed)
Last filled 03/06/17 for an acute back pain... Please advise

## 2017-06-02 ENCOUNTER — Other Ambulatory Visit: Payer: Self-pay | Admitting: Internal Medicine

## 2017-06-03 MED ORDER — ALPRAZOLAM 0.5 MG PO TABS: 0.5000 mg | ORAL_TABLET | Freq: Every day | ORAL | 0 refills | Status: DC | PRN

## 2017-06-03 NOTE — Telephone Encounter (Signed)
Last filled 04/28/17... Please advise 

## 2017-06-16 ENCOUNTER — Other Ambulatory Visit: Payer: Self-pay | Admitting: Internal Medicine

## 2017-06-16 ENCOUNTER — Ambulatory Visit: Payer: BLUE CROSS/BLUE SHIELD | Admitting: Family Medicine

## 2017-06-16 ENCOUNTER — Encounter: Payer: Self-pay | Admitting: Family Medicine

## 2017-06-16 ENCOUNTER — Encounter: Payer: Self-pay | Admitting: Internal Medicine

## 2017-06-16 ENCOUNTER — Telehealth: Payer: Self-pay

## 2017-06-16 DIAGNOSIS — J111 Influenza due to unidentified influenza virus with other respiratory manifestations: Secondary | ICD-10-CM | POA: Diagnosis not present

## 2017-06-16 MED ORDER — HYDROCODONE-HOMATROPINE 5-1.5 MG/5ML PO SYRP: 5.0000 mL | ORAL_SOLUTION | Freq: Four times a day (QID) | ORAL | 0 refills | Status: DC | PRN

## 2017-06-16 NOTE — Telephone Encounter (Signed)
PLEASE NOTE: All timestamps contained within this report are represented as [place] Standard Time. CONFIDENTIALTY NOTICE: This fax transmission is intended only for the addressee. It contains information that is legally privileged, confidential or otherwise protected from use or disclosure. If you are not the intended recipient, you are strictly prohibited from reviewing, disclosing, copying using or disseminating any of this information or taking any action in reliance on or regarding this information. If you have received this fax in error, please notify us immediately by telephone so that we can arrange for its return to us. Phone: [phone], Toll-Free: [phone], Fax: [phone] Page: 1 of 3 Call Id: [id number] [facility] Night - Client >>>Contains Verbal Order - Signature Required<<< TELEPHONE ADVICE RECORD [facility] Patient Name: Lindsay Collins Gender: Female DOB: 03-Jul-1985 Age: [age] Y 2 M 21 D Return Phone Number: [phone] (Primary) Address: [address] City/State/Zip: [place] [address] Client [facility] Night - Client Client Site [facility] - Night Physician Nicki ReaperBaity, Regina - NP Contact Type Call Who Is Calling Patient / Member / Family / Caregiver Call Type Triage / Clinical Relationship To Patient Self Return Phone Number [phone] (Primary) Chief Complaint Nausea Reason for Call Symptomatic / Request for Health Information Initial Comment [facility] and dx with flu. The tamiflu is making her nauseated. Requesting zofran. Translation No Nurse Assessment Nurse: Su Hiltoberts, RN, Werner LeanShamia Date/Time (Eastern Time): 06/15/2017 11:34:42 AM Confirm and document reason for call. If symptomatic, describe symptoms. ---Caller states that she was diagnosed with the flu and taking Tamiflu and that is making her nauseated. Does the patient have any new or worsening  symptoms? ---Yes Will a triage be completed? ---Yes Related visit to physician within the last 2 weeks? ---Yes Does the PT have any chronic conditions? (i.e. diabetes, asthma, etc.) ---No Is the patient pregnant or possibly pregnant? (Ask all females between the ages of 4212-55) ---No Is this a behavioral health or substance abuse call? ---No Nurse: Su Hiltoberts, RN, Werner LeanShamia Date/Time (Eastern Time): 06/15/2017 11:42:38 AM Please select the assessment type ---Standing order Other current medications? ---Yes List current medications. ---Tamiflu and Azithromycin and hydrocodone homatropine Medication allergies? ---No Pharmacy name and phone number. [facility] [address], [place], [place] [address] at [phone] PLEASE NOTE: All timestamps contained within this report are represented as [place] Standard Time. CONFIDENTIALTY NOTICE: This fax transmission is intended only for the addressee. It contains information that is legally privileged, confidential or otherwise protected from use or disclosure. If you are not the intended recipient, you are strictly prohibited from reviewing, disclosing, copying using or disseminating any of this information or taking any action in reliance on or regarding this information. If you have received this fax in error, please notify us immediately by telephone so that we can arrange for its return to us. Phone: [phone], Toll-Free: [phone], Fax: [phone] Page: 2 of 3 Call Id: [id number] Guidelines Guideline Title Affirmed Question Affirmed Notes Nurse Date/Time Lamount Cohen(Eastern Time) Nausea Taking prescription medication that could cause nausea (e.g., narcotics/opiates, antibiotics, OCPs, many others) Su Hil[facility]berts, RN, [facility] 06/15/2017 11:40:49 AM Disp. Time Lamount Cohen(Eastern Time) Disposition Final User 06/15/2017 11:21:38 AM Send To Longest Out Kemper Durielarke, RN, Rebecca 06/15/2017 11:52:49 AM Called On-Call Provider Su Hiltoberts, RN, [facility] 06/15/2017 11:57:16 AM Pharmacy Call Su Hiltoberts, RN,  Werner LeanShamia Reason: [facility] and [facility]. 06/15/2017 11:43:00 AM Call PCP within 24 Hours Yes Su Hiltoberts, RN, Werner LeanShamia Caller Disagree/Comply Comply Caller Understands Yes  PreDisposition InappropriateToAsk Care Advice Given Per Guideline CALL PCP WITHIN 24 HOURS: You need to discuss this with your doctor within the next 24 hours. * IF OFFICE WILL BE OPEN: Call the office when it opens tomorrow morning. CLEAR FLUIDS - Take clear fluids in small amounts until the nausea is resolved for 8 hours: * Sip water or rehydration liquid (Gatorade or Powerade) * Other options: 1/2 strength flat lemon-lime soda or ginger ale AVOID MEDS: * Stop taking all non-prescription medicines. (Reason: may make nausea worse.) * Avoid NSAIDs, which can cause gastritis * Call if vomiting a prescription medicine. CALL BACK IF: * You become worse. CARE ADVICE given per Nausea (Adult) guideline. Verbal Orders/Maintenance Medications Medication Refill Route Dosage Regime Duration Admin Instructions User Name Zofran Oral 4 mg 3 Days Take TID prn for nausea #9 Su Hilt, RN, [facility] Paging DoctorName Phone DateTime Result/Outcome Message Type Notes [place], Betty - MD [phone] 06/15/2017 11:52:49 AM Called On Call Provider - Reached Doctor Paged [place], Betty - MD 06/15/2017 11:53:32 AM Spoke with On Call - General Message Result Spoke with Dr. [place] and confirmed that Zofran will be safe to prescribed with other medications. PLEASE NOTE: All timestamps contained within this report are represented as [place] Standard Time. CONFIDENTIALTY NOTICE: This fax transmission is intended only for the addressee. It contains information that is legally privileged, confidential or otherwise protected from use or disclosure. If you are not the intended recipient, you are strictly prohibited from reviewing, disclosing, copying using or disseminating any of this information or taking any action in reliance on or  regarding this information. If you have received this fax in error, please notify us immediately by telephone so that we can arrange for its return to [place]. Phone: [phone], Toll-Free: [phone], Fax: [phone] Page: 3 of 3 Call Id: [id number] [facility] >>>Contains Verbal Order - Signature Required<<< [address], [address] [place], [place] [address] [phone] Fax: [phone] MEDICATION ORDER [facility] Night - Client [facility] - Night Date: 06/15/2017 From: QI Department To: Nicki Reaper - NP Please sign the order for the approved drug(s) given by our call center nurse on your behalf. Fax to [phone] within 5 business days. Thank you. Date Lamount Cohen Time): 06/15/2017 10:58:14 AM Triage RN: Cipriano Mile, RN NAME: JESSAH DANSER PHONE NUMBER: [phone] (Primary) BIRTHDATE: [birth year] ADDRESS: [id number] CITY/STATE/ZIP: [place] [address] CALLER: Self NAME: Rx Given Medication Refill Route Dosage Regime Duration Admin Instructions User Name Zofran Oral 4 mg 3 Days Take TID prn for nausea #9 Jerline Pain MD Signature Date

## 2017-06-16 NOTE — Telephone Encounter (Signed)
Please see my comments on the medications

## 2017-06-16 NOTE — Telephone Encounter (Signed)
Pt saw Dr Para Marchuncan today.

## 2017-06-16 NOTE — Telephone Encounter (Signed)
Lindsay Collins with [facility] called; pt does not want to wait until end of day and find out she cannot get cough med. Lindsay Collins offered pt an appt today at 4pm with Lindsay Collins and pt could not take that appt and Lindsay Collins has sent request to Lindsay Alphonsus SiasLetvak who is in office and [name]. Lindsay Collins will let pt know request has been sent.

## 2017-06-16 NOTE — Patient Instructions (Signed)
Finish the tamiflu and zithromax.  Use the cough medicine as needed.  Rest and fluids.  Back to work when you feel better.  Take care.  Glad to see you.

## 2017-06-16 NOTE — Telephone Encounter (Signed)
Copied from CRM [id number]. Topic: Quick Communication - Rx Refill/Question >> Jun 16, 2017 10:20 AM Oneal GroutSebastian, Jennifer S wrote: Medication: promethazine-dextromethorphan (PROMETHAZINE-DM) 6.25-15 MG/5ML syrup   Has the patient contacted their pharmacy? No, no refills available, patient has sent mychart message as well   (Agent: If no, request that the patient contact the pharmacy for the refill.)   Preferred Pharm[facility] (with phone number [facility] [place]   Agent: Please be advised that RX refills may take up to 3 business days. We ask that you follow-up with your pharmacy.

## 2017-06-16 NOTE — Telephone Encounter (Signed)
I am not comfortable with giving more narcotic cough syrup so soon (especially since it is not unlikely that the nausea is from this). I also think the zpak could be causing the nausea. Since she was flu positive, the best thing would be to finish out the tamiflu and hold on everything else. She can take the zofran first--just in case. Can send benzonatate 200mg  #60 x 0  1 tid prn (if she wishes).

## 2017-06-16 NOTE — Telephone Encounter (Signed)
I spoke wth pt and she is less nauseated taking the zofran. Pt has 2 more days left of tamiflu and abx. Pt will cb if appt needed. FYI to [name].

## 2017-06-16 NOTE — Telephone Encounter (Signed)
Pt has emailed Rene Kocheregina, I have already forwarded to Dr Alphonsus SiasLetvak

## 2017-06-16 NOTE — Progress Notes (Signed)
Flu test prev positive.  On tamiflu.  Had been sick for about 1 day prior to testing.  Was started on zmax concurrently, with final dose of that tomorrow.  Has been out of work since Thursday.  She is out of hycodan.    No fevers today.  Still coughing, worse at night.  She is working at [employer].  No vomiting- nausea is better today but she hasn't tried to each as much today.  Has zofran to use.  Some aches, better than prev.    She is most bothered by cough at night.  Some sputum still, better today.   Meds, vitals, and allergies reviewed.   ROS: Per HPI unless specifically indicated in ROS section   GEN: nad, alert and oriented HEENT: mucous membranes moist, tm w/o erythema, nasal exam w/o erythema, clear discharge noted,  OP with cobblestoning NECK: supple w/o LA CV: rrr.   PULM: ctab, no inc wob EXT: no edema

## 2017-06-17 DIAGNOSIS — J111 Influenza due to unidentified influenza virus with other respiratory manifestations: Secondary | ICD-10-CM | POA: Insufficient documentation

## 2017-06-17 NOTE — Assessment & Plan Note (Signed)
D/w pt: Finish the tamiflu and zithromax.  Use the hycodan as needed.  Sedation caution. Rest and fluids.  Back to work when feeling better.  Cough should gradually improve.  Lungs are clear.  Okay for outpatient follow-up.

## 2017-07-09 ENCOUNTER — Other Ambulatory Visit: Payer: Self-pay | Admitting: Internal Medicine

## 2017-07-09 NOTE — Telephone Encounter (Signed)
Last filled 06/03/17... Please advise 

## 2017-07-10 NOTE — Telephone Encounter (Signed)
Call pt:  I don't want her taking this frequently without being on daily meds for anxiety first. She can make an appt to discuss if she would like. Refill denied.

## 2017-07-14 ENCOUNTER — Encounter: Payer: Self-pay | Admitting: Primary Care

## 2017-07-14 ENCOUNTER — Ambulatory Visit: Payer: BLUE CROSS/BLUE SHIELD | Admitting: Primary Care

## 2017-07-14 VITALS — BP 104/70 | HR 76 | Temp 98.2°F | Ht 64.0 in | Wt 136.5 lb

## 2017-07-14 DIAGNOSIS — M545 Low back pain, unspecified: Secondary | ICD-10-CM

## 2017-07-14 DIAGNOSIS — N898 Other specified noninflammatory disorders of vagina: Secondary | ICD-10-CM | POA: Diagnosis not present

## 2017-07-14 LAB — POC URINALSYSI DIPSTICK (AUTOMATED)
Bilirubin, UA: NEGATIVE
Blood, UA: NEGATIVE
GLUCOSE UA: NEGATIVE
LEUKOCYTES UA: NEGATIVE
Nitrite, UA: NEGATIVE
PROTEIN UA: NEGATIVE
Spec Grav, UA: >=1.03 — AB (ref 1.010–1.025)
UROBILINOGEN UA: 0.2 U/dL
pH, UA: 6 (ref 5.0–8.0)

## 2017-07-14 NOTE — Progress Notes (Signed)
Subjective:    Patient ID: Lindsay Collins, female    DOB: Apr 19, 1985, 32 y.o.   MRN: [id number]  HPI  Ms. Lindsay Collins is a 32 year old female with a history of nephrolithiasis (years ago) who presents today with a chief complaint of left lower back pain.   Her left lower back pain began one month ago, thinks she may have pulled a muscle. She denies radiation of her back pain, numbness/tingling. She's not taken anything OTC for her symptoms. She denies recent injury/trauma.   She also reports vaginal discharge, pelvic discomfort which began 5 days ago. Her discharge was a pink/light brown color. She denies hematuria, urinary frequency, vaginal itching.   Review of Systems  Constitutional: Negative for fever.  Gastrointestinal: Negative for abdominal pain and nausea.  Genitourinary: Positive for pelvic pain and vaginal discharge. Negative for dysuria, flank pain, frequency, menstrual problem and vaginal bleeding.  Musculoskeletal: Positive for back pain.       Past Medical History:  Diagnosis Date  . Nephrolithiasis      Social History   Socioeconomic History  . Marital status: Single    Spouse name: Not on file  . Number of children: Not on file  . Years of education: Not on file  . Highest education level: Not on file  Occupational History  . Not on file  Social Needs  . Financial resource strain: Not on file  . Food insecurity:    Worry: Not on file    Inability: Not on file  . Transportation needs:    Medical: Not on file    Non-medical: Not on file  Tobacco Use  . Smoking status: Never Smoker  . Smokeless tobacco: Never Used  Substance and Sexual Activity  . Alcohol use: No    Alcohol/week: 0.0 oz  . Drug use: No  . Sexual activity: Yes  Lifestyle  . Physical activity:    Days per week: Not on file    Minutes per session: Not on file  . Stress: Not on file  Relationships  . Social connections:    Talks on phone: Not on file    Gets together: Not on file   Attends religious service: Not on file    Active member of club or organization: Not on file    Attends meetings of clubs or organizations: Not on file    Relationship status: Not on file  . Intimate partner violence:    Fear of current or ex partner: Not on file    Emotionally abused: Not on file    Physically abused: Not on file    Forced sexual activity: Not on file  Other Topics Concern  . Not on file  Social History Narrative  . Not on file    Past Surgical History:  Procedure Laterality Date  . ABDOMINAL HYSTERECTOMY  2015   abnormal PAPs    Family History  Problem Relation Age of Onset  . COPD Mother   . Breast cancer Mother 3030  . COPD Father   . Cancer Father        prostate    No Known Allergies  Current Outpatient Medications on File Prior to Visit  Medication Sig Dispense Refill  . ALPRAZolam (XANAX) 0.5 MG tablet Take 1 tablet (0.5 mg total) by mouth daily as needed for anxiety. 30 tablet 0  . SUMAtriptan (IMITREX) 25 MG tablet TAKE 1 TAB BY MOUTH EVERY 2HRS AS NEEDED FOR MIRGRAINE MAY REPEAT IN 2HRS IF HEADACHE  PERSISTS 10 tablet 2  . ondansetron (ZOFRAN) 4 MG tablet Take 1 tablet (4 mg total) by mouth every 8 (eight) hours as needed. (Patient not taking: Reported on 07/14/2017) 20 tablet 0   No current facility-administered medications on file prior to visit.     BP 104/70   Pulse 76   Temp 98.2 F (36.8 C) (Oral)   Ht 5\' 4"  (1.626 m)   Wt 136 lb 8 oz (61.9 kg)   SpO2 98%   BMI 23.43 kg/m    Objective:   Physical Exam  Constitutional: She appears well-nourished.  Neck: Neck supple.  Cardiovascular: Normal rate and regular rhythm.  Pulmonary/Chest: Effort normal and breath sounds normal.  Genitourinary: Cervix exhibits discharge. Cervix exhibits no motion tenderness. No erythema in the vagina. Vaginal discharge found.  Genitourinary Comments: Mild to moderate amount of whitish vaginal discharge. No foul smell.  Musculoskeletal:       Lumbar  back: She exhibits pain. She exhibits normal range of motion, no tenderness, no bony tenderness and no spasm.       Back:  Negative straight leg raise bilaterally  Skin: Skin is warm and dry.          Assessment & Plan:  Acute Low Back Pain:  Present for one month, located to left lower back. Exam today without evidence of renal stone.  UA: Negative for leuks, blood, nitrites. Could be related to vaginal symptoms or more likely MSK strain. Will have her trial NSAID course x 1 week with heating pad and back exercises.  She will update if no improvement.  Vaginal Discharge:  Present for 5 days, also with pelvic discomfort. UA is negative today for infection. Wet prep completed and sent off for evaluation.  Check gonorrhea, chlamydia tests today.  Lindsay Nest, NP

## 2017-07-14 NOTE — Addendum Note (Signed)
Addended by: Doreene NestLARK, Adie Vilar K on: 07/14/2017 10:20 AM   Modules accepted: Orders

## 2017-07-14 NOTE — Addendum Note (Signed)
Addended by: Tawnya CrookSAMBATH, Keshawna Dix on: 07/14/2017 11:41 AM   Modules accepted: Orders

## 2017-07-14 NOTE — Patient Instructions (Signed)
We will be in touch once we receive your vaginal swab testing.  Try taking Ibuprofen 600 mg three times daily for 5-7 days for back pain. Also try a heating pad. Try some back exercises below.  It was a pleasure meeting you!   Back Exercises The following exercises strengthen the muscles that help to support the back. They also help to keep the lower back flexible. Doing these exercises can help to prevent back pain or lessen existing pain. If you have back pain or discomfort, try doing these exercises 2-3 times each day or as told by your health care provider. When the pain goes away, do them once each day, but increase the number of times that you repeat the steps for each exercise (do more repetitions). If you do not have back pain or discomfort, do these exercises once each day or as told by your health care provider. Exercises Single Knee to Chest  Repeat these steps 3-5 times for each leg: 1. Lie on your back on a firm bed or the floor with your legs extended. 2. Bring one knee to your chest. Your other leg should stay extended and in contact with the floor. 3. Hold your knee in place by grabbing your knee or thigh. 4. Pull on your knee until you feel a gentle stretch in your lower back. 5. Hold the stretch for 10-30 seconds. 6. Slowly release and straighten your leg.  Pelvic Tilt  Repeat these steps 5-10 times: 1. Lie on your back on a firm bed or the floor with your legs extended. 2. Bend your knees so they are pointing toward the ceiling and your feet are flat on the floor. 3. Tighten your lower abdominal muscles to press your lower back against the floor. This motion will tilt your pelvis so your tailbone points up toward the ceiling instead of pointing to your feet or the floor. 4. With gentle tension and even breathing, hold this position for 5-10 seconds.  Cat-Cow  Repeat these steps until your lower back becomes more flexible: 1. Get into a hands-and-knees position on a  firm surface. Keep your hands under your shoulders, and keep your knees under your hips. You may place padding under your knees for comfort. 2. Let your head hang down, and point your tailbone toward the floor so your lower back becomes rounded like the back of a cat. 3. Hold this position for 5 seconds. 4. Slowly lift your head and point your tailbone up toward the ceiling so your back forms a sagging arch like the back of a cow. 5. Hold this position for 5 seconds.  Press-Ups  Repeat these steps 5-10 times: 1. Lie on your abdomen (face-down) on the floor. 2. Place your palms near your head, about shoulder-width apart. 3. While you keep your back as relaxed as possible and keep your hips on the floor, slowly straighten your arms to raise the top half of your body and lift your shoulders. Do not use your back muscles to raise your upper torso. You may adjust the placement of your hands to make yourself more comfortable. 4. Hold this position for 5 seconds while you keep your back relaxed. 5. Slowly return to lying flat on the floor.  Bridges  Repeat these steps 10 times: 1. Lie on your back on a firm surface. 2. Bend your knees so they are pointing toward the ceiling and your feet are flat on the floor. 3. Tighten your buttocks muscles and lift your  buttocks off of the floor until your waist is at almost the same height as your knees. You should feel the muscles working in your buttocks and the back of your thighs. If you do not feel these muscles, slide your feet 1-2 inches farther away from your buttocks. 4. Hold this position for 3-5 seconds. 5. Slowly lower your hips to the starting position, and allow your buttocks muscles to relax completely.  If this exercise is too easy, try doing it with your arms crossed over your chest. Abdominal Crunches  Repeat these steps 5-10 times: 1. Lie on your back on a firm bed or the floor with your legs extended. 2. Bend your knees so they are  pointing toward the ceiling and your feet are flat on the floor. 3. Cross your arms over your chest. 4. Tip your chin slightly toward your chest without bending your neck. 5. Tighten your abdominal muscles and slowly raise your trunk (torso) high enough to lift your shoulder blades a tiny bit off of the floor. Avoid raising your torso higher than that, because it can put too much stress on your low back and it does not help to strengthen your abdominal muscles. 6. Slowly return to your starting position.  Back Lifts Repeat these steps 5-10 times: 1. Lie on your abdomen (face-down) with your arms at your sides, and rest your forehead on the floor. 2. Tighten the muscles in your legs and your buttocks. 3. Slowly lift your chest off of the floor while you keep your hips pressed to the floor. Keep the back of your head in line with the curve in your back. Your eyes should be looking at the floor. 4. Hold this position for 3-5 seconds. 5. Slowly return to your starting position.  Contact a health care provider if:  Your back pain or discomfort gets much worse when you do an exercise.  Your back pain or discomfort does not lessen within 2 hours after you exercise. If you have any of these problems, stop doing these exercises right away. Do not do them again unless your health care provider says that you can. Get help right away if:  You develop sudden, severe back pain. If this happens, stop doing the exercises right away. Do not do them again unless your health care provider says that you can. This information is not intended to replace advice given to you by your health care provider. Make sure you discuss any questions you have with your health care provider. Document Released: 04/25/2004 Document Revised: 07/26/2015 Document Reviewed: 05/12/2014 Elsevier Interactive Patient Education  2017 [employer]

## 2017-07-15 ENCOUNTER — Telehealth: Payer: Self-pay | Admitting: Internal Medicine

## 2017-07-15 ENCOUNTER — Other Ambulatory Visit: Payer: Self-pay | Admitting: Primary Care

## 2017-07-15 DIAGNOSIS — B9689 Other specified bacterial agents as the cause of diseases classified elsewhere: Secondary | ICD-10-CM

## 2017-07-15 DIAGNOSIS — N760 Acute vaginitis: Principal | ICD-10-CM

## 2017-07-15 LAB — WET PREP BY MOLECULAR PROBE
CANDIDA SPECIES: NOT DETECTED
MICRO NUMBER:: 90460609
SPECIMEN QUALITY:: ADEQUATE
Trichomonas vaginosis: NOT DETECTED

## 2017-07-15 LAB — C. TRACHOMATIS/N. GONORRHOEAE RNA
C. trachomatis RNA, TMA: NOT DETECTED
N. gonorrhoeae RNA, TMA: NOT DETECTED

## 2017-07-15 MED ORDER — METRONIDAZOLE 500 MG PO TABS: 500.0000 mg | ORAL_TABLET | Freq: Two times a day (BID) | ORAL | 0 refills | Status: DC

## 2017-07-15 NOTE — Telephone Encounter (Signed)
Copied from CRM [id number]. Topic: General - Other >> Jul 15, 2017 10:13 AM Gerrianne ScalePayne, Keiondra Brookover L wrote: Reason for CRM: patient calling for lab results

## 2017-07-15 NOTE — Telephone Encounter (Signed)
Results reviewed, result note sent via My Chart with treatment.

## 2017-07-15 NOTE — Telephone Encounter (Signed)
Labs have been resulted but have not been reviewed by provider. pls advise

## 2017-08-22 ENCOUNTER — Other Ambulatory Visit: Payer: Self-pay | Admitting: Internal Medicine

## 2017-08-26 NOTE — Telephone Encounter (Signed)
Last filled 03/2017 w/ 2 refills... Please advise

## 2017-11-06 ENCOUNTER — Encounter: Payer: Self-pay | Admitting: Internal Medicine

## 2017-11-25 ENCOUNTER — Encounter: Payer: BLUE CROSS/BLUE SHIELD | Admitting: Internal Medicine

## 2017-11-25 ENCOUNTER — Ambulatory Visit (INDEPENDENT_AMBULATORY_CARE_PROVIDER_SITE_OTHER): Payer: BLUE CROSS/BLUE SHIELD | Admitting: Internal Medicine

## 2017-11-25 ENCOUNTER — Encounter: Payer: Self-pay | Admitting: Internal Medicine

## 2017-11-25 ENCOUNTER — Encounter

## 2017-11-25 ENCOUNTER — Other Ambulatory Visit (HOSPITAL_COMMUNITY)
Admission: RE | Admit: 2017-11-25 | Discharge: 2017-11-25 | Disposition: A | Discharge status: Home / Self Care | Payer: BLUE CROSS/BLUE SHIELD | Source: Ambulatory Visit | Attending: Internal Medicine | Admitting: Internal Medicine

## 2017-11-25 VITALS — BP 106/70 | HR 82 | Temp 98.2°F | Ht 64.0 in | Wt 135.0 lb

## 2017-11-25 DIAGNOSIS — R51 Headache: Secondary | ICD-10-CM | POA: Diagnosis not present

## 2017-11-25 DIAGNOSIS — Z113 Encounter for screening for infections with a predominantly sexual mode of transmission: Secondary | ICD-10-CM | POA: Insufficient documentation

## 2017-11-25 DIAGNOSIS — F419 Anxiety disorder, unspecified: Secondary | ICD-10-CM

## 2017-11-25 DIAGNOSIS — R87629 Unspecified abnormal cytological findings in specimens from vagina: Secondary | ICD-10-CM | POA: Diagnosis not present

## 2017-11-25 DIAGNOSIS — R519 Headache, unspecified: Secondary | ICD-10-CM

## 2017-11-25 DIAGNOSIS — Z0000 Encounter for general adult medical examination without abnormal findings: Secondary | ICD-10-CM | POA: Diagnosis not present

## 2017-11-25 DIAGNOSIS — G4700 Insomnia, unspecified: Secondary | ICD-10-CM | POA: Insufficient documentation

## 2017-11-25 DIAGNOSIS — F5104 Psychophysiologic insomnia: Secondary | ICD-10-CM | POA: Diagnosis not present

## 2017-11-25 MED ORDER — TRAZODONE HCL 50 MG PO TABS: 25.0000 mg | ORAL_TABLET | Freq: Every evening | ORAL | 3 refills | Status: DC | PRN

## 2017-11-25 MED ORDER — ALPRAZOLAM 0.5 MG PO TABS: 0.5000 mg | ORAL_TABLET | Freq: Every day | ORAL | 0 refills | Status: DC | PRN

## 2017-11-25 NOTE — Assessment & Plan Note (Signed)
Discussed avoiding triggers Continue Imitrex prn

## 2017-11-25 NOTE — Progress Notes (Signed)
Subjective:    Patient ID: Lindsay Collins, female    DOB: [birth year], 32 y.o.   MRN: [id number]  HPI  Pt presents to the clinic today for her annual exam. She is also due to follow up chronic conditions.  Headaches: These are occurring 2 x week. She thinks it is triggered by fluorescent lights and looking at a computer screen all day. She has had an eye exam. She does have glasses but reports wearing them doesn't seem to decrease her headaches. She takes Imitrex as needed with good relief.  Anxiety: Intermittent. Triggered by stress of being a single mother, financial problems. She takes Xanax as needed. She needs a refill of this today.  Insomnia: Triggered by stress. Currently taking her Xanax at bedtime to help with sleep. Has never taken RX medications for sleep.  Flu: 12/2016 Tetanus: 12/2011 Pap Smear: 08/2016, HPV +, hysterectomy Dentist: biannually  Diet: She does eat meat. She consumes fruits and veggies daily. She does consumes some fried foods. She drinks mostly water and juice. Exercise:  Review of Systems      Past Medical History:  Diagnosis Date  . Nephrolithiasis     Current Outpatient Medications  Medication Sig Dispense Refill  . ALPRAZolam (XANAX) 0.5 MG tablet Take 1 tablet (0.5 mg total) by mouth daily as needed for anxiety. 30 tablet 0  . ondansetron (ZOFRAN) 4 MG tablet Take 1 tablet (4 mg total) by mouth every 8 (eight) hours as needed. 20 tablet 0  . SUMAtriptan (IMITREX) 25 MG tablet TAKE 1 TAB BY MOUTH EVERY 2HRS AS NEEDED FOR MIRGRAINE MAY REPEAT IN 2HRS IF HEADACHE PERSISTS 10 tablet 2   No current facility-administered medications for this visit.     No Known Allergies  Family History  Problem Relation Age of Onset  . COPD Mother   . Breast cancer Mother 79  . COPD Father   . Cancer Father        prostate    Social History   Socioeconomic History  . Marital status: Single    Spouse name: Not on file  . Number of children: Not on  file  . Years of education: Not on file  . Highest education level: Not on file  Occupational History  . Not on file  Social Needs  . Financial resource strain: Not on file  . Food insecurity:    Worry: Not on file    Inability: Not on file  . Transportation needs:    Medical: Not on file    Non-medical: Not on file  Tobacco Use  . Smoking status: Never Smoker  . Smokeless tobacco: Never Used  Substance and Sexual Activity  . Alcohol use: No    Alcohol/week: 0.0 standard drinks  . Drug use: No  . Sexual activity: Yes  Lifestyle  . Physical activity:    Days per week: Not on file    Minutes per session: Not on file  . Stress: Not on file  Relationships  . Social connections:    Talks on phone: Not on file    Gets together: Not on file    Attends religious service: Not on file    Active member of club or organization: Not on file    Attends meetings of clubs or organizations: Not on file    Relationship status: Not on file  . Intimate partner violence:    Fear of current or ex partner: Not on file    Emotionally abused: Not  on file    Physically abused: Not on file    Forced sexual activity: Not on file  Other Topics Concern  . Not on file  Social History Narrative  . Not on file     Constitutional: Pt reports intermittent headaches. Denies fever, malaise, fatigue, headache or abrupt weight changes.  HEENT: Denies eye pain, eye redness, ear pain, ringing in the ears, wax buildup, runny nose, nasal congestion, bloody nose, or sore throat. Respiratory: Denies difficulty breathing, shortness of breath, cough or sputum production.   Cardiovascular: Denies chest pain, chest tightness, palpitations or swelling in the hands or feet.  Gastrointestinal: Denies abdominal pain, bloating, constipation, diarrhea or blood in the stool.  GU: Denies urgency, frequency, pain with urination, burning sensation, blood in urine, odor or discharge. Musculoskeletal: Denies decrease in  range of motion, difficulty with gait, muscle pain or joint pain and swelling.  Skin: Denies redness, rashes, lesions or ulcercations.  Neurological: Pt reports insomnia. Denies dizziness, difficulty with memory, difficulty with speech or problems with balance and coordination.  Psych: Pt reports anxiety. Denies  depression, SI/HI.  No other specific complaints in a complete review of systems (except as listed in HPI above).  Objective:   Physical Exam  BP 106/70   Pulse 82   Temp 98.2 F (36.8 C) (Oral)   Ht 5\' 4"  (1.626 m)   Wt 135 lb (61.2 kg)   SpO2 98%   BMI 23.17 kg/m  Wt Readings from Last 3 Encounters:  11/25/17 135 lb (61.2 kg)  07/14/17 136 lb 8 oz (61.9 kg)  06/16/17 139 lb 12 oz (63.4 kg)    General: Appears her stated age, well developed, well nourished in NAD. Skin: Warm, dry and intact.  HEENT: Head: normal shape and size; Eyes: sclera white, no icterus, conjunctiva pink, PERRLA and EOMs intact; Ears: Tm's gray and intact, normal light reflex; Throat/Mouth: Teeth present, mucosa pink and moist, no exudate, lesions or ulcerations noted.  Neck:  Neck supple, trachea midline. No masses, lumps or thyromegaly present.  Cardiovascular: Normal rate and rhythm. S1,S2 noted.  No murmur, rubs or gallops noted. No JVD or BLE edema. Pulmonary/Chest: Normal effort and positive vesicular breath sounds. No respiratory distress. No wheezes, rales or ronchi noted.  Abdomen: Soft and nontender. Normal bowel sounds. No distention or masses noted. Liver, spleen and kidneys non palpable. Pelvic: Normal female anatomy. Vagina swabbed. No uterus or ovaries present. Musculoskeletal: Strength 5/5 BUE/BLE. No difficulty with gait.  Neurological: Alert and oriented. Cranial nerves II-XII grossly intact. Coordination normal.  Psychiatric: Mood and affect normal. Behavior is normal. Judgment and thought content normal.     BMET    Component Value Date/Time   NA 138 09/24/2016 0917   NA  138 09/23/2013 0536   K 4.1 09/24/2016 0917   K 3.9 09/23/2013 0536   CL 104 09/24/2016 0917   CL 105 09/23/2013 0536   CO2 26 09/24/2016 0917   CO2 29 09/23/2013 0536   GLUCOSE 108 (H) 09/24/2016 0917   GLUCOSE 131 (H) 09/23/2013 0536   BUN 15 09/24/2016 0917   BUN 13 09/23/2013 0536   CREATININE 0.91 09/24/2016 0917   CREATININE 0.83 09/23/2013 0536   CALCIUM 9.5 09/24/2016 0917   CALCIUM 8.5 09/23/2013 0536   GFRNONAA >60 09/23/2013 0536   GFRAA >60 09/23/2013 0536    Lipid Panel     Component Value Date/Time   CHOL 157 09/24/2016 0917   TRIG 55.0 09/24/2016 0917   HDL  68.60 09/24/2016 0917   CHOLHDL 2 09/24/2016 0917   VLDL 11.0 09/24/2016 0917   LDLCALC 77 09/24/2016 0917    CBC    Component Value Date/Time   WBC 3.7 (L) 09/24/2016 0917   RBC 4.20 09/24/2016 0917   HGB 13.5 09/24/2016 0917   HGB 10.0 (L) 09/23/2013 0536   HCT 38.8 09/24/2016 0917   HCT 28.8 (L) 09/23/2013 0536   PLT 215.0 09/24/2016 0917   PLT 138 (L) 09/23/2013 0536   MCV 92.3 09/24/2016 0917   MCV 93 09/23/2013 0536   MCH 32.5 09/23/2013 0536   MCHC 34.7 09/24/2016 0917   RDW 13.5 09/24/2016 0917   RDW 13.6 09/23/2013 0536   LYMPHSABS 1.4 06/11/2013 0939   LYMPHSABS 1.9 02/25/2012 1424   MONOABS 0.3 06/11/2013 0939   MONOABS 0.4 02/25/2012 1424   EOSABS 0.0 06/11/2013 0939   EOSABS 0.1 02/25/2012 1424   BASOSABS 0.0 06/11/2013 0939   BASOSABS 0.0 02/25/2012 1424    Hgb A1C No results found for: HGBA1C          Assessment & Plan:   Preventative Health Maintenance:  Encouraged her to get a flu shot in the fall Tetanus UTD Pap smear today with STD screening Encouraged her to consume a balanced diet and exercise regimen Advised her to see an eye doctor and dentist annually Will check CBC, CMET, Lipid profile today  RTC in 1 year, sooner if needed Nicki Reaper, NP

## 2017-11-25 NOTE — Patient Instructions (Signed)

## 2017-11-25 NOTE — Assessment & Plan Note (Signed)
Continue Xanax prn- discussed sedation and addiction precaution Refilled today

## 2017-11-25 NOTE — Assessment & Plan Note (Signed)
Will trial Trazadone 

## 2017-11-26 LAB — CBC
HCT: 35.6 % — ABNORMAL LOW (ref 36.0–46.0)
Hemoglobin: 12.5 g/dL (ref 12.0–15.0)
MCHC: 34.9 g/dL (ref 30.0–36.0)
MCV: 93 fl (ref 78.0–100.0)
Platelets: 203 10*3/uL (ref 150.0–400.0)
RBC: 3.83 Mil/uL — AB (ref 3.87–5.11)
RDW: 12.7 % (ref 11.5–15.5)
WBC: 4.8 10*3/uL (ref 4.0–10.5)

## 2017-11-26 LAB — COMPREHENSIVE METABOLIC PANEL
ALBUMIN: 4.6 g/dL (ref 3.5–5.2)
ALK PHOS: 39 U/L (ref 39–117)
ALT: 9 U/L (ref 0–35)
AST: 12 U/L (ref 0–37)
BUN: 13 mg/dL (ref 6–23)
CHLORIDE: 104 meq/L (ref 96–112)
CO2: 25 mEq/L (ref 19–32)
CREATININE: 0.83 mg/dL (ref 0.40–1.20)
Calcium: 9.5 mg/dL (ref 8.4–10.5)
GFR: 84.31 mL/min (ref >60.00)
Glucose, Bld: 92 mg/dL (ref 70–99)
Potassium: 3.8 mEq/L (ref 3.5–5.1)
SODIUM: 137 meq/L (ref 135–145)
TOTAL PROTEIN: 6.9 g/dL (ref 6.0–8.3)
Total Bilirubin: 0.9 mg/dL (ref 0.2–1.2)

## 2017-11-26 LAB — LIPID PANEL
CHOLESTEROL: 146 mg/dL (ref 0–200)
HDL: 68.1 mg/dL (ref >39.00)
LDL CALC: 70 mg/dL (ref 0–99)
NonHDL: 77.71
Total CHOL/HDL Ratio: 2
Triglycerides: 38 mg/dL (ref 0.0–149.0)
VLDL: 7.6 mg/dL (ref 0.0–40.0)

## 2017-11-27 ENCOUNTER — Encounter: Payer: Self-pay | Admitting: Internal Medicine

## 2017-11-28 LAB — CYTOLOGY - PAP
BACTERIAL VAGINITIS: NEGATIVE
CHLAMYDIA, DNA PROBE: NEGATIVE
Candida vaginitis: NEGATIVE
Diagnosis: UNDETERMINED — AB
HPV: NOT DETECTED
NEISSERIA GONORRHEA: NEGATIVE
TRICH (WINDOWPATH): NEGATIVE

## 2017-12-17 ENCOUNTER — Other Ambulatory Visit: Payer: Self-pay | Admitting: Internal Medicine

## 2018-02-02 ENCOUNTER — Other Ambulatory Visit: Payer: Self-pay | Admitting: Internal Medicine

## 2018-02-02 MED ORDER — ALPRAZOLAM 0.5 MG PO TABS: 0.5000 mg | ORAL_TABLET | Freq: Every day | ORAL | 0 refills | Status: DC | PRN

## 2018-02-02 MED ORDER — TRAZODONE HCL 50 MG PO TABS: 25.0000 mg | ORAL_TABLET | Freq: Every evening | ORAL | 0 refills | Status: DC | PRN

## 2018-02-02 NOTE — Telephone Encounter (Signed)
Xanax last filled 11/25/2017... Please advise

## 2018-04-07 ENCOUNTER — Other Ambulatory Visit: Payer: Self-pay | Admitting: Internal Medicine

## 2018-04-08 MED ORDER — ALPRAZOLAM 0.5 MG PO TABS: 0.5000 mg | ORAL_TABLET | Freq: Every day | ORAL | 0 refills | Status: DC | PRN

## 2018-04-08 NOTE — Telephone Encounter (Signed)
Last OV 10/2017-CPE. Last Rx 02/02/2018 #30

## 2018-04-27 ENCOUNTER — Other Ambulatory Visit: Payer: Self-pay | Admitting: Internal Medicine

## 2018-05-28 ENCOUNTER — Other Ambulatory Visit: Payer: Self-pay | Admitting: Internal Medicine

## 2018-05-29 MED ORDER — TRAZODONE HCL 50 MG PO TABS: 25.0000 mg | ORAL_TABLET | Freq: Every evening | ORAL | 0 refills | Status: DC | PRN

## 2018-05-29 MED ORDER — ALPRAZOLAM 0.5 MG PO TABS: 0.5000 mg | ORAL_TABLET | Freq: Every day | ORAL | 0 refills | Status: DC | PRN

## 2018-05-29 NOTE — Telephone Encounter (Signed)
Last filled 04/08/18... please advise

## 2018-08-03 ENCOUNTER — Encounter: Payer: Self-pay | Admitting: Internal Medicine

## 2018-08-18 ENCOUNTER — Encounter: Payer: Self-pay | Admitting: Internal Medicine

## 2018-08-27 ENCOUNTER — Other Ambulatory Visit: Payer: Self-pay | Admitting: Internal Medicine

## 2018-08-28 ENCOUNTER — Other Ambulatory Visit: Payer: Self-pay | Admitting: Internal Medicine

## 2018-08-28 MED ORDER — ALPRAZOLAM 0.5 MG PO TABS: 0.5000 mg | ORAL_TABLET | Freq: Every day | ORAL | 0 refills | Status: DC | PRN

## 2018-08-28 NOTE — Telephone Encounter (Signed)
Last filled 05/29/2018... please advise

## 2018-10-07 ENCOUNTER — Other Ambulatory Visit: Payer: Self-pay | Admitting: Internal Medicine

## 2018-10-07 NOTE — Telephone Encounter (Signed)
Last filled 04/08/2018 with 2 refills... please advise

## 2018-10-20 ENCOUNTER — Other Ambulatory Visit: Payer: Self-pay | Admitting: Internal Medicine

## 2018-10-20 DIAGNOSIS — Z1231 Encounter for screening mammogram for malignant neoplasm of breast: Secondary | ICD-10-CM

## 2018-11-09 ENCOUNTER — Other Ambulatory Visit: Payer: Self-pay | Admitting: Internal Medicine

## 2018-11-09 ENCOUNTER — Ambulatory Visit
Admission: RE | Admit: 2018-11-09 | Discharge: 2018-11-09 | Disposition: A | Discharge status: Home / Self Care | Payer: BLUE CROSS/BLUE SHIELD | Source: Ambulatory Visit | Attending: Internal Medicine | Admitting: Internal Medicine

## 2018-11-09 ENCOUNTER — Other Ambulatory Visit: Payer: Self-pay

## 2018-11-09 DIAGNOSIS — Z1231 Encounter for screening mammogram for malignant neoplasm of breast: Secondary | ICD-10-CM | POA: Insufficient documentation

## 2018-11-10 ENCOUNTER — Encounter: Payer: Self-pay | Admitting: Internal Medicine

## 2018-11-10 MED ORDER — ALPRAZOLAM 0.5 MG PO TABS: 0.5000 mg | ORAL_TABLET | Freq: Every day | ORAL | 0 refills | Status: DC | PRN

## 2018-11-10 NOTE — Telephone Encounter (Signed)
Looks like pt has appt with [facility] family [facility] in [place] on 8/26 FYI, pt maybe switching PCP

## 2018-11-10 NOTE — Telephone Encounter (Signed)
Last refill until schedules annual exam

## 2018-11-10 NOTE — Telephone Encounter (Signed)
noted 

## 2018-11-10 NOTE — Telephone Encounter (Signed)
Last filled 08/28/2018... please advise  

## 2018-11-11 ENCOUNTER — Encounter: Payer: Self-pay | Admitting: Internal Medicine

## 2018-11-11 ENCOUNTER — Other Ambulatory Visit: Payer: Self-pay | Admitting: Internal Medicine

## 2018-11-11 DIAGNOSIS — R928 Other abnormal and inconclusive findings on diagnostic imaging of breast: Secondary | ICD-10-CM

## 2018-11-11 DIAGNOSIS — R921 Mammographic calcification found on diagnostic imaging of breast: Secondary | ICD-10-CM

## 2018-11-16 ENCOUNTER — Other Ambulatory Visit: Payer: Self-pay

## 2018-11-16 ENCOUNTER — Ambulatory Visit
Admission: RE | Admit: 2018-11-16 | Discharge: 2018-11-16 | Disposition: A | Discharge status: Home / Self Care | Payer: BLUE CROSS/BLUE SHIELD | Source: Ambulatory Visit | Attending: Internal Medicine | Admitting: Internal Medicine

## 2018-11-16 DIAGNOSIS — R921 Mammographic calcification found on diagnostic imaging of breast: Secondary | ICD-10-CM

## 2018-11-16 DIAGNOSIS — R928 Other abnormal and inconclusive findings on diagnostic imaging of breast: Secondary | ICD-10-CM | POA: Diagnosis not present

## 2018-11-23 ENCOUNTER — Other Ambulatory Visit: Payer: Self-pay | Admitting: Internal Medicine

## 2019-02-01 ENCOUNTER — Other Ambulatory Visit: Payer: Self-pay | Admitting: Internal Medicine

## 2019-02-01 NOTE — Telephone Encounter (Signed)
Last filled 11/10/2018, last OV 10/2017... please advise

## 2019-02-02 ENCOUNTER — Encounter: Payer: Self-pay | Admitting: Internal Medicine

## 2019-02-05 ENCOUNTER — Other Ambulatory Visit: Payer: Self-pay

## 2019-02-05 MED ORDER — ALPRAZOLAM 0.5 MG PO TABS: 0.5000 mg | ORAL_TABLET | Freq: Every day | ORAL | 0 refills | Status: DC | PRN

## 2019-02-05 NOTE — Telephone Encounter (Signed)
Pt has scheduled CPE for Dec... Xanax last filled 11/10/2018... please advise

## 2019-02-22 DIAGNOSIS — Z8619 Personal history of other infectious and parasitic diseases: Secondary | ICD-10-CM

## 2019-02-22 HISTORY — DX: Personal history of other infectious and parasitic diseases: Z86.19

## 2019-03-18 ENCOUNTER — Encounter: Payer: Self-pay | Admitting: Internal Medicine

## 2019-03-18 ENCOUNTER — Other Ambulatory Visit: Payer: Self-pay

## 2019-03-18 ENCOUNTER — Ambulatory Visit (INDEPENDENT_AMBULATORY_CARE_PROVIDER_SITE_OTHER): Payer: BLUE CROSS/BLUE SHIELD | Admitting: Internal Medicine

## 2019-03-18 VITALS — BP 106/68 | HR 73 | Temp 97.8°F | Ht 64.0 in | Wt 129.0 lb

## 2019-03-18 DIAGNOSIS — F419 Anxiety disorder, unspecified: Secondary | ICD-10-CM | POA: Diagnosis not present

## 2019-03-18 DIAGNOSIS — F5104 Psychophysiologic insomnia: Secondary | ICD-10-CM

## 2019-03-18 DIAGNOSIS — R519 Headache, unspecified: Secondary | ICD-10-CM

## 2019-03-18 DIAGNOSIS — Z0000 Encounter for general adult medical examination without abnormal findings: Secondary | ICD-10-CM | POA: Diagnosis not present

## 2019-03-18 DIAGNOSIS — Z79899 Other long term (current) drug therapy: Secondary | ICD-10-CM

## 2019-03-18 NOTE — Assessment & Plan Note (Signed)
Continue Trazadone prn Will monitor

## 2019-03-18 NOTE — Assessment & Plan Note (Signed)
Continue Xanax prn CSA and UDS today 

## 2019-03-18 NOTE — Assessment & Plan Note (Signed)
Continue Imitrex prn Will monitor 

## 2019-03-18 NOTE — Patient Instructions (Signed)
Health Maintenance, Female Adopting a healthy lifestyle and getting preventive care are important in promoting health and wellness. Ask your health care provider about:  The right schedule for you to have regular tests and exams.  Things you can do on your own to prevent diseases and keep yourself healthy. What should I know about diet, weight, and exercise? Eat a healthy diet   Eat a diet that includes plenty of vegetables, fruits, low-fat dairy products, and lean protein.  Do not eat a lot of foods that are high in solid fats, added sugars, or sodium. Maintain a healthy weight Body mass index (BMI) is used to identify weight problems. It estimates body fat based on height and weight. Your health care provider can help determine your BMI and help you achieve or maintain a healthy weight. Get regular exercise Get regular exercise. This is one of the most important things you can do for your health. Most adults should:  Exercise for at least 150 minutes each week. The exercise should increase your heart rate and make you sweat (moderate-intensity exercise).  Do strengthening exercises at least twice a week. This is in addition to the moderate-intensity exercise.  Spend less time sitting. Even light physical activity can be beneficial. Watch cholesterol and blood lipids Have your blood tested for lipids and cholesterol at 34 years of age, then have this test every 5 years. Have your cholesterol levels checked more often if:  Your lipid or cholesterol levels are high.  You are older than 34 years of age.  You are at high risk for heart disease. What should I know about cancer screening? Depending on your health history and family history, you may need to have cancer screening at various ages. This may include screening for:  Breast cancer.  Cervical cancer.  Colorectal cancer.  Skin cancer.  Lung cancer. What should I know about heart disease, diabetes, and high blood  pressure? Blood pressure and heart disease  High blood pressure causes heart disease and increases the risk of stroke. This is more likely to develop in people who have high blood pressure readings, are of African descent, or are overweight.  Have your blood pressure checked: ? Every 3-5 years if you are 18-39 years of age. ? Every year if you are 40 years old or older. Diabetes Have regular diabetes screenings. This checks your fasting blood sugar level. Have the screening done:  Once every three years after age 40 if you are at a normal weight and have a low risk for diabetes.  More often and at a younger age if you are overweight or have a high risk for diabetes. What should I know about preventing infection? Hepatitis B If you have a higher risk for hepatitis B, you should be screened for this virus. Talk with your health care provider to find out if you are at risk for hepatitis B infection. Hepatitis C Testing is recommended for:  Everyone born from 1945 through 1965.  Anyone with known risk factors for hepatitis C. Sexually transmitted infections (STIs)  Get screened for STIs, including gonorrhea and chlamydia, if: ? You are sexually active and are younger than 34 years of age. ? You are older than 34 years of age and your health care provider tells you that you are at risk for this type of infection. ? Your sexual activity has changed since you were last screened, and you are at increased risk for chlamydia or gonorrhea. Ask your health care provider if   you are at risk.  Ask your health care provider about whether you are at high risk for HIV. Your health care provider may recommend a prescription medicine to help prevent HIV infection. If you choose to take medicine to prevent HIV, you should first get tested for HIV. You should then be tested every 3 months for as long as you are taking the medicine. Pregnancy  If you are about to stop having your period (premenopausal) and  you may become pregnant, seek counseling before you get pregnant.  Take 400 to 800 micrograms (mcg) of folic acid every day if you become pregnant.  Ask for birth control (contraception) if you want to prevent pregnancy. Osteoporosis and menopause Osteoporosis is a disease in which the bones lose minerals and strength with aging. This can result in bone fractures. If you are 65 years old or older, or if you are at risk for osteoporosis and fractures, ask your health care provider if you should:  Be screened for bone loss.  Take a calcium or vitamin D supplement to lower your risk of fractures.  Be given hormone replacement therapy (HRT) to treat symptoms of menopause. Follow these instructions at home: Lifestyle  Do not use any products that contain nicotine or tobacco, such as cigarettes, e-cigarettes, and chewing tobacco. If you need help quitting, ask your health care provider.  Do not use street drugs.  Do not share needles.  Ask your health care provider for help if you need support or information about quitting drugs. Alcohol use  Do not drink alcohol if: ? Your health care provider tells you not to drink. ? You are pregnant, may be pregnant, or are planning to become pregnant.  If you drink alcohol: ? Limit how much you use to 0-1 drink a day. ? Limit intake if you are breastfeeding.  Be aware of how much alcohol is in your drink. In the [place] one drink equals one 12 oz bottle of beer (355 mL), one 5 oz glass of wine (148 mL), or one 1 oz glass of hard liquor (44 mL). General instructions  Schedule regular health, dental, and eye exams.  Stay current with your vaccines.  Tell your health care provider if: ? You often feel depressed. ? You have ever been abused or do not feel safe at home. Summary  Adopting a healthy lifestyle and getting preventive care are important in promoting health and wellness.  Follow your health care provider's instructions about healthy  diet, exercising, and getting tested or screened for diseases.  Follow your health care provider's instructions on monitoring your cholesterol and blood pressure. This information is not intended to replace advice given to you by your health care provider. Make sure you discuss any questions you have with your health care provider. Document Released: 10/01/2010 Document Revised: 03/11/2018 Document Reviewed: 03/11/2018 Elsevier Patient Education  2020 [employer]  

## 2019-03-18 NOTE — Progress Notes (Signed)
Subjective:    Patient ID: Lindsay ButteryAlicia J Collins, female    DOB: February 02, 1986, 33 y.o.   MRN: [id number]  HPI  Pt presents to the clinic today for her annual exam. She is also due to follow up chronic conditions.  Frequent Headaches: These occur once every few weeks. Triggered by stress. She takes Imitrex as needed with good relief of symptoms.  Anxiety: Intermittent. She takes Xanax as needed. She is not currently seeing a therapist. She denies depression, SI/HI. There is no CSA or UDS on file.  Insomnia: She has trouble falling asleep. She takes Trazadone as needed with good relief of symptoms.   Flu: 12/2018 Tetanus: 10/2018 Pap Smear: hysterectomy Dentist: biannually  Diet: She does eat meat. She consumes fruits and veggies daily. She occassionally eats fried foods. She drinks mostly water, some tea and soda. Exercise: Walking*  Review of Systems      Past Medical History:  Diagnosis Date  . Nephrolithiasis     Current Outpatient Medications  Medication Sig Dispense Refill  . ALPRAZolam (XANAX) 0.5 MG tablet Take 1 tablet (0.5 mg total) by mouth daily as needed for anxiety. 30 tablet 0  . SUMAtriptan (IMITREX) 25 MG tablet TAKE 1 TAB BY MOUTH EVERY 2HRS AS NEEDED FOR MIRGRAINE MAY REPEAT IN 2HRS IF HEADACHE PERSISTS 10 tablet 1  . traZODone (DESYREL) 50 MG tablet Take 0.5-1 tablets (25-50 mg total) by mouth at bedtime as needed for sleep. SCHEDULE PHYSICAL 90 tablet 0   No current facility-administered medications for this visit.    No Known Allergies  Family History  Problem Relation Age of Onset  . COPD Mother   . Breast cancer Mother [id number]  . COPD Father   . Cancer Father        prostate    Social History   Socioeconomic History  . Marital status: Single    Spouse name: Not on file  . Number of children: Not on file  . Years of education: Not on file  . Highest education level: Not on file  Occupational History  . Not on file  Tobacco Use  . Smoking status:  Never Smoker  . Smokeless tobacco: Never Used  Substance and Sexual Activity  . Alcohol use: No    Alcohol/week: 0.0 standard drinks  . Drug use: No  . Sexual activity: Yes  Other Topics Concern  . Not on file  Social History Narrative  . Not on file   Social Determinants of Health   Financial Resource Strain:   . Difficulty of Paying Living Expenses: Not on file  Food Insecurity:   . Worried About Programme researcher, broadcasting/film/videounning Out of Food in the Last Year: Not on file  . Ran Out of Food in the Last Year: Not on file  Transportation Needs:   . Lack of Transportation (Medical): Not on file  . Lack of Transportation (Non-Medical): Not on file  Physical Activity:   . Days of Exercise per Week: Not on file  . Minutes of Exercise per Session: Not on file  Stress:   . Feeling of Stress : Not on file  Social Connections:   . Frequency of Communication with Friends and Family: Not on file  . Frequency of Social Gatherings with Friends and Family: Not on file  . Attends Religious Services: Not on file  . Active Member of Clubs or Organizations: Not on file  . Attends BankerClub or Organization Meetings: Not on file  . Marital Status: Not on file  Intimate Partner Violence:   . Fear of Current or Ex-Partner: Not on file  . Emotionally Abused: Not on file  . Physically Abused: Not on file  . Sexually Abused: Not on file     Constitutional: Pt reports intermittent headaches. Denies fever, malaise, fatigue, or abrupt weight changes.  HEENT: Denies eye pain, eye redness, ear pain, ringing in the ears, wax buildup, runny nose, nasal congestion, bloody nose, or sore throat. Respiratory: Denies difficulty breathing, shortness of breath, cough or sputum production.   Cardiovascular: Denies chest pain, chest tightness, palpitations or swelling in the hands or feet.  Gastrointestinal: Denies abdominal pain, bloating, constipation, diarrhea or blood in the stool.  GU: Denies urgency, frequency, pain with urination,  burning sensation, blood in urine, odor or discharge. Musculoskeletal: Denies decrease in range of motion, difficulty with gait, muscle pain or joint pain and swelling.  Skin: Denies redness, rashes, lesions or ulcercations.  Neurological: Pt reports dizziness. Denies difficulty with memory, difficulty with speech or problems with balance and coordination.  Psych: Pt reports anxiety. Denies depression, SI/HI.  No other specific complaints in a complete review of systems (except as listed in HPI above).  Objective:   Physical Exam   Wt Readings from Last 3 Encounters:  11/25/17 135 lb (61.2 kg)  07/14/17 136 lb 8 oz (61.9 kg)  06/16/17 139 lb 12 oz (63.4 kg)    General: Appears her stated age, well developed, well nourished in NAD. Skin: Warm, dry and intact. No rashes noted. HEENT: Head: normal shape and size; Eyes: sclera white, no icterus, conjunctiva pink, PERRLA and EOMs intact;  Neck:  Neck supple, trachea midline. No masses, lumps or thyromegaly present.  Cardiovascular: Normal rate and rhythm. S1,S2 noted.  No murmur, rubs or gallops noted. No JVD or BLE edema.  Pulmonary/Chest: Normal effort and positive vesicular breath sounds. No respiratory distress. No wheezes, rales or ronchi noted.  Abdomen: Soft and nontender. Normal bowel sounds. No distention or masses noted. Liver, spleen and kidneys non palpable. Musculoskeletal: Strength 5/5 BUE/BLE. No difficulty with gait.  Neurological: Alert and oriented. Cranial nerves II-XII grossly intact. Coordination normal.  Psychiatric: Mood and affect normal. Behavior is normal. Judgment and thought content normal.     BMET    Component Value Date/Time   NA 137 11/25/2017 1458   NA 138 09/23/2013 0536   K 3.8 11/25/2017 1458   K 3.9 09/23/2013 0536   CL 104 11/25/2017 1458   CL 105 09/23/2013 0536   CO2 25 11/25/2017 1458   CO2 29 09/23/2013 0536   GLUCOSE 92 11/25/2017 1458   GLUCOSE 131 (H) 09/23/2013 0536   BUN 13  11/25/2017 1458   BUN 13 09/23/2013 0536   CREATININE 0.83 11/25/2017 1458   CREATININE 0.83 09/23/2013 0536   CALCIUM 9.5 11/25/2017 1458   CALCIUM 8.5 09/23/2013 0536   GFRNONAA >60 09/23/2013 0536   GFRAA >60 09/23/2013 0536    Lipid Panel     Component Value Date/Time   CHOL 146 11/25/2017 1458   TRIG 38.0 11/25/2017 1458   HDL 68.10 11/25/2017 1458   CHOLHDL 2 11/25/2017 1458   VLDL 7.6 11/25/2017 1458   LDLCALC 70 11/25/2017 1458    CBC    Component Value Date/Time   WBC 4.8 11/25/2017 1458   RBC 3.83 (L) 11/25/2017 1458   HGB 12.5 11/25/2017 1458   HGB 10.0 (L) 09/23/2013 0536   HCT 35.6 (L) 11/25/2017 1458   HCT 28.8 (L) 09/23/2013 0536  PLT 203.0 11/25/2017 1458   PLT 138 (L) 09/23/2013 0536   MCV 93.0 11/25/2017 1458   MCV 93 09/23/2013 0536   MCH 32.5 09/23/2013 0536   MCHC 34.9 11/25/2017 1458   RDW 12.7 11/25/2017 1458   RDW 13.6 09/23/2013 0536   LYMPHSABS 1.4 06/11/2013 0939   LYMPHSABS 1.9 02/25/2012 1424   MONOABS 0.3 06/11/2013 0939   MONOABS 0.4 02/25/2012 1424   EOSABS 0.0 06/11/2013 0939   EOSABS 0.1 02/25/2012 1424   BASOSABS 0.0 06/11/2013 0939   BASOSABS 0.0 02/25/2012 1424    Hgb A1C No results found for: HGBA1C          Assessment & Plan:   Preventative Health Maintenance:  Flu shot UTD Tetanus UTD She no longer needs pap smears Encouraged her to consume a balanced diet and exercise regimen Advised her to see a dentist annually Will check CBC, CMET, Lipid profile today  RTC in 1 year, sooner if needed Webb Silversmith, NP This visit occurred during the SARS-CoV-2 public health emergency.  Safety protocols were in place, including screening questions prior to the visit, additional usage of staff PPE, and extensive cleaning of exam room while observing appropriate contact time as indicated for disinfecting solutions.

## 2019-03-19 LAB — CBC
HCT: 36.9 % (ref 36.0–46.0)
Hemoglobin: 12.8 g/dL (ref 12.0–15.0)
MCHC: 34.7 g/dL (ref 30.0–36.0)
MCV: 93.2 fl (ref 78.0–100.0)
Platelets: 204 10*3/uL (ref 150.0–400.0)
RBC: 3.96 Mil/uL (ref 3.87–5.11)
RDW: 13.5 % (ref 11.5–15.5)
WBC: 5.4 10*3/uL (ref 4.0–10.5)

## 2019-03-19 LAB — PAIN MGMT, PROFILE 8 W/CONF, U
6 Acetylmorphine: NEGATIVE ng/mL
Alcohol Metabolites: NEGATIVE ng/mL (ref <500)
Amphetamines: NEGATIVE ng/mL
Benzodiazepines: NEGATIVE ng/mL
Buprenorphine, Urine: NEGATIVE ng/mL
Cocaine Metabolite: NEGATIVE ng/mL
Creatinine: 91 mg/dL
MDMA: NEGATIVE ng/mL
Marijuana Metabolite: NEGATIVE ng/mL
Opiates: NEGATIVE ng/mL
Oxidant: NEGATIVE ug/mL
Oxycodone: NEGATIVE ng/mL
pH: 6.7 (ref 4.5–9.0)

## 2019-03-19 LAB — COMPREHENSIVE METABOLIC PANEL
ALT: 10 U/L (ref 0–35)
AST: 12 U/L (ref 0–37)
Albumin: 4.6 g/dL (ref 3.5–5.2)
Alkaline Phosphatase: 44 U/L (ref 39–117)
BUN: 13 mg/dL (ref 6–23)
CO2: 30 mEq/L (ref 19–32)
Calcium: 9.6 mg/dL (ref 8.4–10.5)
Chloride: 103 mEq/L (ref 96–112)
Creatinine, Ser: 0.85 mg/dL (ref 0.40–1.20)
GFR: 76.57 mL/min (ref >60.00)
Glucose, Bld: 96 mg/dL (ref 70–99)
Potassium: 4 mEq/L (ref 3.5–5.1)
Sodium: 137 mEq/L (ref 135–145)
Total Bilirubin: 0.4 mg/dL (ref 0.2–1.2)
Total Protein: 6.7 g/dL (ref 6.0–8.3)

## 2019-03-19 LAB — LIPID PANEL
Cholesterol: 151 mg/dL (ref 0–200)
HDL: 71.4 mg/dL (ref >39.00)
LDL Cholesterol: 67 mg/dL (ref 0–99)
NonHDL: 79.5
Total CHOL/HDL Ratio: 2
Triglycerides: 64 mg/dL (ref 0.0–149.0)
VLDL: 12.8 mg/dL (ref 0.0–40.0)

## 2019-08-22 ENCOUNTER — Other Ambulatory Visit: Payer: Self-pay | Admitting: Internal Medicine

## 2019-08-23 NOTE — Telephone Encounter (Signed)
Last filled 09/2018 with 1 refill... CPE 03/2019... please advise

## 2019-08-24 ENCOUNTER — Other Ambulatory Visit: Payer: Self-pay | Admitting: Internal Medicine

## 2019-08-25 MED ORDER — ALPRAZOLAM 0.5 MG PO TABS: 0.5000 mg | ORAL_TABLET | Freq: Every day | ORAL | 0 refills | Status: DC | PRN

## 2019-08-25 NOTE — Telephone Encounter (Signed)
Last filled 04/16/2018... please advise 

## 2019-08-27 ENCOUNTER — Encounter: Payer: Self-pay | Admitting: Internal Medicine

## 2019-12-28 ENCOUNTER — Other Ambulatory Visit: Payer: Self-pay | Admitting: Internal Medicine

## 2019-12-29 NOTE — Telephone Encounter (Signed)
Last filled 08/24/2019 with 1 refill... please advise

## 2020-03-17 ENCOUNTER — Other Ambulatory Visit: Payer: Self-pay

## 2020-03-17 ENCOUNTER — Encounter: Payer: Self-pay | Admitting: Internal Medicine

## 2020-03-17 ENCOUNTER — Ambulatory Visit (INDEPENDENT_AMBULATORY_CARE_PROVIDER_SITE_OTHER): Payer: BLUE CROSS/BLUE SHIELD | Admitting: Internal Medicine

## 2020-03-17 VITALS — BP 108/66 | HR 76 | Temp 97.5°F | Ht 64.0 in | Wt 139.0 lb

## 2020-03-17 DIAGNOSIS — Z0001 Encounter for general adult medical examination with abnormal findings: Secondary | ICD-10-CM | POA: Diagnosis not present

## 2020-03-17 DIAGNOSIS — J029 Acute pharyngitis, unspecified: Secondary | ICD-10-CM | POA: Diagnosis not present

## 2020-03-17 DIAGNOSIS — R519 Headache, unspecified: Secondary | ICD-10-CM | POA: Diagnosis not present

## 2020-03-17 DIAGNOSIS — F5104 Psychophysiologic insomnia: Secondary | ICD-10-CM | POA: Diagnosis not present

## 2020-03-17 DIAGNOSIS — F419 Anxiety disorder, unspecified: Secondary | ICD-10-CM | POA: Diagnosis not present

## 2020-03-17 DIAGNOSIS — Z1159 Encounter for screening for other viral diseases: Secondary | ICD-10-CM | POA: Diagnosis not present

## 2020-03-17 LAB — POCT RAPID STREP A (OFFICE): Rapid Strep A Screen: POSITIVE — AB

## 2020-03-17 MED ORDER — AMOXICILLIN 500 MG PO CAPS: 500.0000 mg | ORAL_CAPSULE | Freq: Three times a day (TID) | ORAL | 0 refills | Status: DC

## 2020-03-17 NOTE — Progress Notes (Signed)
Subjective:    Patient ID: Lindsay Collins, female    DOB: 25-Feb-1986, 34 y.o.   MRN: [id number]  HPI  Pt presents to the clinic today for her annual exam. She is also due to follow up chronic conditions.  Anxiety: Intermittent. She takes Xanax as needed with good relief of symptoms. She is not seeing a therapist. She denies depression, SI/HI.  Frequent Headaches: These occur 2-3 times per month. She takes Imitrex as needed with good relief of symptoms. She does not follow with neurology.  Insomnia: She does not take Trazadone because it makes her feel jittery. There is no sleep study on file.  Flu: 12/2019 Tetanus: 10/2018 Covid: Pfizer Pap Smear: 10/2017, hysterectomy Dentist: biannually  Diet: She does eat meat. She consumes fruits and veggies daily. She occasionally eats fried foods. She drinks mostly water, Mt. Dew. Exercise: Walking  Review of Systems      Past Medical History:  Diagnosis Date  . History of shingles 02/22/2019  . Nephrolithiasis     Current Outpatient Medications  Medication Sig Dispense Refill  . ALPRAZolam (XANAX) 0.5 MG tablet Take 1 tablet (0.5 mg total) by mouth daily as needed for anxiety. 30 tablet 0  . SUMAtriptan (IMITREX) 25 MG tablet TAKE 1 TAB BY MOUTH EVERY 2HRS AS NEEDED FOR MIRGRAINE MAY REPEAT IN 2HRS IF HEADACHE PERSISTS 10 tablet 1  . traZODone (DESYREL) 50 MG tablet Take 0.5-1 tablets (25-50 mg total) by mouth at bedtime as needed for sleep. SCHEDULE PHYSICAL 90 tablet 0   No current facility-administered medications for this visit.    No Known Allergies  Family History  Problem Relation Age of Onset  . COPD Mother   . Breast cancer Mother 79  . COPD Father   . Cancer Father        prostate    Social History   Socioeconomic History  . Marital status: Single    Spouse name: Not on file  . Number of children: Not on file  . Years of education: Not on file  . Highest education level: Not on file  Occupational History   . Not on file  Tobacco Use  . Smoking status: Never Smoker  . Smokeless tobacco: Never Used  Substance and Sexual Activity  . Alcohol use: No    Alcohol/week: 0.0 standard drinks  . Drug use: No  . Sexual activity: Yes  Other Topics Concern  . Not on file  Social History Narrative  . Not on file   Social Determinants of Health   Financial Resource Strain: Not on file  Food Insecurity: Not on file  Transportation Needs: Not on file  Physical Activity: Not on file  Stress: Not on file  Social Connections: Not on file  Intimate Partner Violence: Not on file     Constitutional: Pt reports intermittent headaches. Denies fever, malaise, fatigue, or abrupt weight changes.  HEENT: Pt reports sore throat. Denies eye pain, eye redness, ear pain, ringing in the ears, wax buildup, runny nose, nasal congestion, bloody nose. Respiratory: Denies difficulty breathing, shortness of breath, cough or sputum production.   Cardiovascular: Denies chest pain, chest tightness, palpitations or swelling in the hands or feet.  Gastrointestinal: Denies abdominal pain, bloating, constipation, diarrhea or blood in the stool.  GU: Denies urgency, frequency, pain with urination, burning sensation, blood in urine, odor or discharge. Musculoskeletal: Denies decrease in range of motion, difficulty with gait, muscle pain or joint pain and swelling.  Skin: Denies redness, rashes,  lesions or ulcercations.  Neurological: Pt reports insomnia. Denies dizziness, difficulty with memory, difficulty with speech or problems with balance and coordination.  Psych: Pt reports intermittent anxiety. Denies depression, SI/HI.  No other specific complaints in a complete review of systems (except as listed in HPI above).  Objective:   Physical Exam  BP 108/66   Pulse 76   Temp (!) 97.5 F (36.4 C) (Temporal)   Ht 5\' 4"  (1.626 m)   Wt 139 lb (63 kg)   SpO2 98%   BMI 23.86 kg/m   Wt Readings from Last 3 Encounters:   03/18/19 129 lb (58.5 kg)  11/25/17 135 lb (61.2 kg)  07/14/17 136 lb 8 oz (61.9 kg)    General: Appears her stated age, well developed, well nourished in NAD. Skin: Warm, dry and intact. No rashes, lesions or ulcerations noted. HEENT: Head: normal shape and size; Eyes: sclera white, no icterus, conjunctiva pink, PERRLA and EOMs intact; Throat/Mouth: Teeth present, mucosa pink and moist, posterior pharynx erythema without exudate, lesions or ulcerations noted.  Neck:  Neck supple, trachea midline. No masses, lumps or thyromegaly present.  Cardiovascular: Normal rate and rhythm. S1,S2 noted.  No murmur, rubs or gallops noted. No JVD or BLE edema. Pulmonary/Chest: Normal effort and positive vesicular breath sounds. No respiratory distress. No wheezes, rales or ronchi noted.  Abdomen: Soft and nontender. Normal bowel sounds. No distention or masses noted. Liver, spleen and kidneys non palpable. Musculoskeletal: Strength 5/5 BUE/BLE. No difficulty with gait.  Neurological: Alert and oriented. Cranial nerves II-XII grossly intact. Coordination normal.  Psychiatric: Mood and affect normal. Behavior is normal. Judgment and thought content normal.    BMET    Component Value Date/Time   NA 137 03/18/2019 1550   NA 138 09/23/2013 0536   K 4.0 03/18/2019 1550   K 3.9 09/23/2013 0536   CL 103 03/18/2019 1550   CL 105 09/23/2013 0536   CO2 30 03/18/2019 1550   CO2 29 09/23/2013 0536   GLUCOSE 96 03/18/2019 1550   GLUCOSE 131 (H) 09/23/2013 0536   BUN 13 03/18/2019 1550   BUN 13 09/23/2013 0536   CREATININE 0.85 03/18/2019 1550   CREATININE 0.83 09/23/2013 0536   CALCIUM 9.6 03/18/2019 1550   CALCIUM 8.5 09/23/2013 0536   GFRNONAA >60 09/23/2013 0536   GFRAA >60 09/23/2013 0536    Lipid Panel     Component Value Date/Time   CHOL 151 03/18/2019 1550   TRIG 64.0 03/18/2019 1550   HDL 71.40 03/18/2019 1550   CHOLHDL 2 03/18/2019 1550   VLDL 12.8 03/18/2019 1550   LDLCALC 67  03/18/2019 1550    CBC    Component Value Date/Time   WBC 5.4 03/18/2019 1550   RBC 3.96 03/18/2019 1550   HGB 12.8 03/18/2019 1550   HGB 10.0 (L) 09/23/2013 0536   HCT 36.9 03/18/2019 1550   HCT 28.8 (L) 09/23/2013 0536   PLT 204.0 03/18/2019 1550   PLT 138 (L) 09/23/2013 0536   MCV 93.2 03/18/2019 1550   MCV 93 09/23/2013 0536   MCH 32.5 09/23/2013 0536   MCHC 34.7 03/18/2019 1550   RDW 13.5 03/18/2019 1550   RDW 13.6 09/23/2013 0536   LYMPHSABS 1.4 06/11/2013 0939   LYMPHSABS 1.9 02/25/2012 1424   MONOABS 0.3 06/11/2013 0939   MONOABS 0.4 02/25/2012 1424   EOSABS 0.0 06/11/2013 0939   EOSABS 0.1 02/25/2012 1424   BASOSABS 0.0 06/11/2013 0939   BASOSABS 0.0 02/25/2012 1424    Hgb  A1C No results found for: HGBA1C         Assessment & Plan:   Preventative Health Maintenance:  Flu shot UTD Tetanus UTD Covid vaccine UTD She no longer needs pap smears Encouraged her to consume a balanced diet and exercise regimen Advised her to see an eye doctor and dentist annually Will check CBC, CMET, Lipid and Hep C today  Sore Throat:  RST: positive RX for Amoxil 500 mg TID x 10 days   RTC in 1 year, sooner if needed Nicki Reaper, NP This visit occurred during the SARS-CoV-2 public health emergency.  Safety protocols were in place, including screening questions prior to the visit, additional usage of staff PPE, and extensive cleaning of exam room while observing appropriate contact time as indicated for disinfecting solutions.

## 2020-03-17 NOTE — Patient Instructions (Signed)
Health Maintenance, Female Adopting a healthy lifestyle and getting preventive care are important in promoting health and wellness. Ask your health care provider about:  The right schedule for you to have regular tests and exams.  Things you can do on your own to prevent diseases and keep yourself healthy. What should I know about diet, weight, and exercise? Eat a healthy diet   Eat a diet that includes plenty of vegetables, fruits, low-fat dairy products, and lean protein.  Do not eat a lot of foods that are high in solid fats, added sugars, or sodium. Maintain a healthy weight Body mass index (BMI) is used to identify weight problems. It estimates body fat based on height and weight. Your health care provider can help determine your BMI and help you achieve or maintain a healthy weight. Get regular exercise Get regular exercise. This is one of the most important things you can do for your health. Most adults should:  Exercise for at least 150 minutes each week. The exercise should increase your heart rate and make you sweat (moderate-intensity exercise).  Do strengthening exercises at least twice a week. This is in addition to the moderate-intensity exercise.  Spend less time sitting. Even light physical activity can be beneficial. Watch cholesterol and blood lipids Have your blood tested for lipids and cholesterol at 35 years of age, then have this test every 5 years. Have your cholesterol levels checked more often if:  Your lipid or cholesterol levels are high.  You are older than 35 years of age.  You are at high risk for heart disease. What should I know about cancer screening? Depending on your health history and family history, you may need to have cancer screening at various ages. This may include screening for:  Breast cancer.  Cervical cancer.  Colorectal cancer.  Skin cancer.  Lung cancer. What should I know about heart disease, diabetes, and high blood  pressure? Blood pressure and heart disease  High blood pressure causes heart disease and increases the risk of stroke. This is more likely to develop in people who have high blood pressure readings, are of African descent, or are overweight.  Have your blood pressure checked: ? Every 3-5 years if you are 18-39 years of age. ? Every year if you are 40 years old or older. Diabetes Have regular diabetes screenings. This checks your fasting blood sugar level. Have the screening done:  Once every three years after age 40 if you are at a normal weight and have a low risk for diabetes.  More often and at a younger age if you are overweight or have a high risk for diabetes. What should I know about preventing infection? Hepatitis B If you have a higher risk for hepatitis B, you should be screened for this virus. Talk with your health care provider to find out if you are at risk for hepatitis B infection. Hepatitis C Testing is recommended for:  Everyone born from 1945 through 1965.  Anyone with known risk factors for hepatitis C. Sexually transmitted infections (STIs)  Get screened for STIs, including gonorrhea and chlamydia, if: ? You are sexually active and are younger than 35 years of age. ? You are older than 35 years of age and your health care provider tells you that you are at risk for this type of infection. ? Your sexual activity has changed since you were last screened, and you are at increased risk for chlamydia or gonorrhea. Ask your health care provider if   you are at risk.  Ask your health care provider about whether you are at high risk for HIV. Your health care provider may recommend a prescription medicine to help prevent HIV infection. If you choose to take medicine to prevent HIV, you should first get tested for HIV. You should then be tested every 3 months for as long as you are taking the medicine. Pregnancy  If you are about to stop having your period (premenopausal) and  you may become pregnant, seek counseling before you get pregnant.  Take 400 to 800 micrograms (mcg) of folic acid every day if you become pregnant.  Ask for birth control (contraception) if you want to prevent pregnancy. Osteoporosis and menopause Osteoporosis is a disease in which the bones lose minerals and strength with aging. This can result in bone fractures. If you are 65 years old or older, or if you are at risk for osteoporosis and fractures, ask your health care provider if you should:  Be screened for bone loss.  Take a calcium or vitamin D supplement to lower your risk of fractures.  Be given hormone replacement therapy (HRT) to treat symptoms of menopause. Follow these instructions at home: Lifestyle  Do not use any products that contain nicotine or tobacco, such as cigarettes, e-cigarettes, and chewing tobacco. If you need help quitting, ask your health care provider.  Do not use street drugs.  Do not share needles.  Ask your health care provider for help if you need support or information about quitting drugs. Alcohol use  Do not drink alcohol if: ? Your health care provider tells you not to drink. ? You are pregnant, may be pregnant, or are planning to become pregnant.  If you drink alcohol: ? Limit how much you use to 0-1 drink a day. ? Limit intake if you are breastfeeding.  Be aware of how much alcohol is in your drink. In the [place] one drink equals one 12 oz bottle of beer (355 mL), one 5 oz glass of wine (148 mL), or one 1 oz glass of hard liquor (44 mL). General instructions  Schedule regular health, dental, and eye exams.  Stay current with your vaccines.  Tell your health care provider if: ? You often feel depressed. ? You have ever been abused or do not feel safe at home. Summary  Adopting a healthy lifestyle and getting preventive care are important in promoting health and wellness.  Follow your health care provider's instructions about healthy  diet, exercising, and getting tested or screened for diseases.  Follow your health care provider's instructions on monitoring your cholesterol and blood pressure. This information is not intended to replace advice given to you by your health care provider. Make sure you discuss any questions you have with your health care provider. Document Revised: 03/11/2018 Document Reviewed: 03/11/2018 Elsevier Patient Education  2020 [employer]  

## 2020-03-19 ENCOUNTER — Encounter: Payer: Self-pay | Admitting: Internal Medicine

## 2020-03-20 ENCOUNTER — Other Ambulatory Visit: Payer: Self-pay | Admitting: Internal Medicine

## 2020-03-20 ENCOUNTER — Encounter: Payer: Self-pay | Admitting: Internal Medicine

## 2020-03-20 ENCOUNTER — Encounter: Payer: BLUE CROSS/BLUE SHIELD | Admitting: Internal Medicine

## 2020-03-20 LAB — CBC
HCT: 36.1 % (ref 35.0–45.0)
Hemoglobin: 12.6 g/dL (ref 11.7–15.5)
MCH: 32.4 pg (ref 27.0–33.0)
MCHC: 34.9 g/dL (ref 32.0–36.0)
MCV: 92.8 fL (ref 80.0–100.0)
MPV: 9.9 fL (ref 7.5–12.5)
Platelets: 214 10*3/uL (ref 140–400)
RBC: 3.89 10*6/uL (ref 3.80–5.10)
RDW: 12 % (ref 11.0–15.0)
WBC: 7.5 10*3/uL (ref 3.8–10.8)

## 2020-03-20 LAB — LIPID PANEL
Cholesterol: 153 mg/dL (ref <200)
HDL: 69 mg/dL (ref >=50)
LDL Cholesterol (Calc): 70 mg/dL (calc)
Non-HDL Cholesterol (Calc): 84 mg/dL (calc) (ref <130)
Total CHOL/HDL Ratio: 2.2 (calc) (ref <5.0)
Triglycerides: 61 mg/dL (ref <150)

## 2020-03-20 LAB — COMPREHENSIVE METABOLIC PANEL
AG Ratio: 2.1 (calc) (ref 1.0–2.5)
ALT: 9 U/L (ref 6–29)
AST: 14 U/L (ref 10–30)
Albumin: 4.5 g/dL (ref 3.6–5.1)
Alkaline phosphatase (APISO): 50 U/L (ref 31–125)
BUN: 15 mg/dL (ref 7–25)
CO2: 26 mmol/L (ref 20–32)
Calcium: 9.6 mg/dL (ref 8.6–10.2)
Chloride: 103 mmol/L (ref 98–110)
Creat: 0.91 mg/dL (ref 0.50–1.10)
Globulin: 2.1 g/dL (calc) (ref 1.9–3.7)
Glucose, Bld: 95 mg/dL (ref 65–99)
Potassium: 4.3 mmol/L (ref 3.5–5.3)
Sodium: 139 mmol/L (ref 135–146)
Total Bilirubin: 0.5 mg/dL (ref 0.2–1.2)
Total Protein: 6.6 g/dL (ref 6.1–8.1)

## 2020-03-20 LAB — HEPATITIS C ANTIBODY
Hepatitis C Ab: NONREACTIVE
SIGNAL TO CUT-OFF: 0.01 (ref <1.00)

## 2020-03-20 MED ORDER — HYDROCODONE-HOMATROPINE 5-1.5 MG/5ML PO SYRP: 5.0000 mL | ORAL_SOLUTION | Freq: Three times a day (TID) | ORAL | 0 refills | Status: DC | PRN

## 2020-03-20 NOTE — Assessment & Plan Note (Signed)
Continue Imitrex as needed Will monitor 

## 2020-03-20 NOTE — Assessment & Plan Note (Signed)
Will d/c Trazadone Advised her to try Xanax prior to sleep, but do not want her using nightly due to addictive properties

## 2020-03-20 NOTE — Assessment & Plan Note (Signed)
Stable on Xanax CSA and UDS today Support offered 

## 2020-03-20 NOTE — Telephone Encounter (Signed)
Pt called in checking to see if she can have cough medicine. States she can hardly sleep staying up. Please advise.

## 2020-03-20 NOTE — Telephone Encounter (Cosign Needed)
RX cough syrup sent to pharmacy

## 2020-03-20 NOTE — Telephone Encounter (Signed)
Patient called again checking on status. Please advise.

## 2020-03-20 NOTE — Telephone Encounter (Signed)
That cough syrup is on national back order apparently

## 2020-04-04 ENCOUNTER — Encounter: Payer: Self-pay | Admitting: Internal Medicine

## 2020-04-04 ENCOUNTER — Other Ambulatory Visit: Payer: Self-pay | Admitting: Internal Medicine

## 2020-04-04 MED ORDER — HYDROCODONE-HOMATROPINE 5-1.5 MG/5ML PO SYRP: 5.0000 mL | ORAL_SOLUTION | Freq: Three times a day (TID) | ORAL | 0 refills | Status: DC | PRN

## 2020-04-04 NOTE — Telephone Encounter (Signed)
Last filled 08/25/19... please advise

## 2020-04-05 MED ORDER — ALPRAZOLAM 0.5 MG PO TABS: 0.5000 mg | ORAL_TABLET | Freq: Every day | ORAL | 0 refills | Status: DC | PRN

## 2020-07-10 ENCOUNTER — Encounter: Payer: Self-pay | Admitting: Internal Medicine

## 2020-07-13 ENCOUNTER — Other Ambulatory Visit: Payer: Self-pay

## 2020-07-13 ENCOUNTER — Ambulatory Visit (INDEPENDENT_AMBULATORY_CARE_PROVIDER_SITE_OTHER): Payer: BLUE CROSS/BLUE SHIELD | Admitting: Internal Medicine

## 2020-07-13 ENCOUNTER — Encounter: Payer: Self-pay | Admitting: Internal Medicine

## 2020-07-13 VITALS — BP 98/58 | HR 90 | Temp 97.9°F | Resp 20 | Wt 138.8 lb

## 2020-07-13 DIAGNOSIS — M67431 Ganglion, right wrist: Secondary | ICD-10-CM | POA: Diagnosis not present

## 2020-07-13 DIAGNOSIS — T50905A Adverse effect of unspecified drugs, medicaments and biological substances, initial encounter: Secondary | ICD-10-CM | POA: Diagnosis not present

## 2020-07-13 DIAGNOSIS — R21 Rash and other nonspecific skin eruption: Secondary | ICD-10-CM | POA: Diagnosis not present

## 2020-07-13 DIAGNOSIS — R202 Paresthesia of skin: Secondary | ICD-10-CM | POA: Diagnosis not present

## 2020-07-13 MED ORDER — PREDNISONE 10 MG PO TABS: ORAL_TABLET | ORAL | 0 refills | Status: DC

## 2020-07-13 NOTE — Patient Instructions (Signed)
Drug Rash A drug rash occurs when a medicine causes a change in the color or texture of the skin. It can develop minutes, hours, or days after you take the medicine. The rash may appear on a small area of skin or all over your body. What are the causes? This condition is usually caused by your body's reaction (allergy) to a medicine. It can also be caused by exposure to sunlight after taking a medicine that makes your skin sensitive to light. Though any medicine can cause a rash or reaction, medicines that are more likely to cause rashes include:  Penicillin.  Antibiotic medicines.  Medicines that treat seizures.  Medicines that treat cancer (chemotherapy).  Aspirin and other NSAIDs.  Injectable dyes that contain iodine.  Insulin. What are the signs or symptoms? Symptoms of this condition include:  Redness.  Tiny bumps.  Peeling.  Itching.  Itchy welts (hives).  Swelling.   How is this diagnosed? This condition may be diagnosed based on:  A physical exam.  Tests to find out which medicine caused the rash. These tests may include: ? Skin tests. ? Blood tests. ? Challenge test. For this test, you stop taking all the medicines that you do not need to take. Then, you start taking them again by adding back one medicine at a time. How is this treated? This condition is treated with medicines, including:  Antihistamine. This may be given to relieve itching.  NSAIDs. These may be given to reduce swelling and to treat pain.  A steroid medicine. This may be given to reduce swelling. The rash usually goes away when you stop taking the medicine that caused it. Follow these instructions at home:  Take over-the-counter and prescription medicines only as told by your health care provider.  Tell all your health care providers about any medicine reactions that you have had in the past.  If your rash was caused by sensitivity to sunlight, and while your rash is  healing: ? Avoid being in the sun if possible, especially when it is strongest, usually between 10 a.m. and 4 p.m. ? Cover your skin with pants, long sleeves, and a hat when you are exposed to sunlight.  If you have hives: ? Take a cool shower or use a cool compress to relieve itchiness. ? Take over-the-counter antihistamines, as recommended by your health care provider, until the hives are gone. Hives are not contagious.  Keep all follow-up visits as told by your health care provider. This is important. Contact a health care provider if you have:  A fever.  A rash that is not going away.  A rash that gets worse.  A rash that comes back.  Wheezing or coughing. Get help right away if:  You start to have breathing problems.  You start to have shortness of breath.  Your face or throat starts to swell.  You have severe weakness with dizziness or fainting.  You have chest pain. These symptoms may represent a serious problem that is an emergency. Do not wait to see if the symptoms will go away. Get medical help right away. Call your local emergency services (911 in the [place] Do not drive yourself to the hospital. Summary  A drug rash occurs when a medicine causes a change in the color or texture of the skin. The rash may appear on a small area of skin or all over your body.  It can develop minutes, hours, or days after you take the medicine.  Your health   care provider will do various tests to determine what medicine caused your rash.  The rash may be treated with medicine to relieve itching, swelling, and pain. This information is not intended to replace advice given to you by your health care provider. Make sure you discuss any questions you have with your health care provider. Document Revised: 06/30/2019 Document Reviewed: 06/30/2019 Elsevier Patient Education  2021 [employer]  

## 2020-07-13 NOTE — Progress Notes (Signed)
Subjective:    Patient ID: Lindsay Collins, female    DOB: [birth year], 35 y.o.   MRN: [id number]  HPI  Pt presents to the clinic today with c/o a rash on her bilateral arms, bilateral thighs and neck. She noticed this after getting a cortisone injection in her right wrist or a ganglion cyst, and being put on Meloxicam, this past Friday. The rash itches.  It has continued to spread.  She denies changes in diet, soaps, lotion or detergents.  She has not been working outside in the yard.  No one in her home has a similar rash.  She has tried Benadryl and Famotidine OTC with minimal relief of symptoms.  Review of Systems      Past Medical History:  Diagnosis Date  . History of shingles 02/22/2019  . Nephrolithiasis     Current Outpatient Medications  Medication Sig Dispense Refill  . ALPRAZolam (XANAX) 0.5 MG tablet Take 1 tablet (0.5 mg total) by mouth daily as needed for anxiety. 30 tablet 0  . amoxicillin (AMOXIL) 500 MG capsule Take 1 capsule (500 mg total) by mouth 3 (three) times daily. 30 capsule 0  . HYDROcodone-homatropine (HYCODAN) 5-1.5 MG/5ML syrup Take 5 mLs by mouth every 8 (eight) hours as needed for cough. 120 mL 0  . SUMAtriptan (IMITREX) 25 MG tablet TAKE 1 TAB BY MOUTH EVERY 2HRS AS NEEDED FOR MIRGRAINE MAY REPEAT IN 2HRS IF HEADACHE PERSISTS 10 tablet 1   No current facility-administered medications for this visit.    No Known Allergies  Family History  Problem Relation Age of Onset  . COPD Mother   . Breast cancer Mother 18  . COPD Father   . Cancer Father        prostate    Social History   Socioeconomic History  . Marital status: Single    Spouse name: Not on file  . Number of children: Not on file  . Years of education: Not on file  . Highest education level: Not on file  Occupational History  . Not on file  Tobacco Use  . Smoking status: Never Smoker  . Smokeless tobacco: Never Used  Substance and Sexual Activity  . Alcohol use: No     Alcohol/week: 0.0 standard drinks  . Drug use: No  . Sexual activity: Yes  Other Topics Concern  . Not on file  Social History Narrative  . Not on file   Social Determinants of Health   Financial Resource Strain: Not on file  Food Insecurity: Not on file  Transportation Needs: Not on file  Physical Activity: Not on file  Stress: Not on file  Social Connections: Not on file  Intimate Partner Violence: Not on file     Constitutional: Denies fever, malaise, fatigue, headache or abrupt weight changes.  HEENT: Denies eye pain, eye redness, ear pain, ringing in the ears, wax buildup, runny nose, nasal congestion, bloody nose, or sore throat. Respiratory: Denies difficulty breathing, shortness of breath, cough or sputum production.   Cardiovascular: Denies chest pain, chest tightness, palpitations or swelling in the hands or feet.  Skin: Pt reports rash. Denies lesions or ulcercations.  MSK: Patient reports right wrist pain and decreased range of motion.  Denies joint swelling. Neuro: Patient reports numbness and tingling of right thumb.  Denies difficulty with coordination.  No other specific complaints in a complete review of systems (except as listed in HPI above).  Objective:   Physical Exam  BP (!) 98/58  Pulse 90   Temp 97.9 F (36.6 C)   Resp 20   Wt 138 lb 12.8 oz (63 kg)   SpO2 98%   BMI 23.82 kg/m   Wt Readings from Last 3 Encounters:  03/17/20 139 lb (63 kg)  03/18/19 129 lb (58.5 kg)  11/25/17 135 lb (61.2 kg)    General: Appears her stated age, well developed, well nourished in NAD. Skin: Scattered maculopapular rash noted to bilateral forearms, right side of neck.  She reports similar rash to bilateral thighs.  Ganglion cyst noted of dorsal aspect of right wrist. HEENT: Head: normal shape and size; Eyes: no periorbital edema noted; Throat/Mouth: No tongue swelling noted.  Cardiovascular: Normal rate and rhythm.  Pulmonary/Chest: Normal effort and positive  vesicular breath sounds. No respiratory distress. No wheezes, rales or ronchi noted.  MSK: Decreased flexion, extension and rotation of the right wrist secondary to pain.  No joint swelling noted. Neurological: Alert and oriented.  Sensation intact to RUE.  BMET    Component Value Date/Time   NA 139 03/17/2020 1430   NA 138 09/23/2013 0536   K 4.3 03/17/2020 1430   K 3.9 09/23/2013 0536   CL 103 03/17/2020 1430   CL 105 09/23/2013 0536   CO2 26 03/17/2020 1430   CO2 29 09/23/2013 0536   GLUCOSE 95 03/17/2020 1430   GLUCOSE 131 (H) 09/23/2013 0536   BUN 15 03/17/2020 1430   BUN 13 09/23/2013 0536   CREATININE 0.91 03/17/2020 1430   CALCIUM 9.6 03/17/2020 1430   CALCIUM 8.5 09/23/2013 0536   GFRNONAA >60 09/23/2013 0536   GFRAA >60 09/23/2013 0536    Lipid Panel     Component Value Date/Time   CHOL 153 03/17/2020 1430   TRIG 61 03/17/2020 1430   HDL 69 03/17/2020 1430   CHOLHDL 2.2 03/17/2020 1430   VLDL 12.8 03/18/2019 1550   LDLCALC 70 03/17/2020 1430    CBC    Component Value Date/Time   WBC 7.5 03/17/2020 1430   RBC 3.89 03/17/2020 1430   HGB 12.6 03/17/2020 1430   HGB 10.0 (L) 09/23/2013 0536   HCT 36.1 03/17/2020 1430   HCT 28.8 (L) 09/23/2013 0536   PLT 214 03/17/2020 1430   PLT 138 (L) 09/23/2013 0536   MCV 92.8 03/17/2020 1430   MCV 93 09/23/2013 0536   MCH 32.4 03/17/2020 1430   MCHC 34.9 03/17/2020 1430   RDW 12.0 03/17/2020 1430   RDW 13.6 09/23/2013 0536   LYMPHSABS 1.4 06/11/2013 0939   LYMPHSABS 1.9 02/25/2012 1424   MONOABS 0.3 06/11/2013 0939   MONOABS 0.4 02/25/2012 1424   EOSABS 0.0 06/11/2013 0939   EOSABS 0.1 02/25/2012 1424   BASOSABS 0.0 06/11/2013 0939   BASOSABS 0.0 02/25/2012 1424    Hgb A1C No results found for: HGBA1C          Assessment & Plan:   Rash secondary to Medication Reaction:  Unsure if this is related to meloxicam or the cortisone injection Rx for Pred taper x6 days Okay to continue Benadryl and  Famotidine OTC as needed  Ganglion Cyst-Right Wrist, Paresthesia of RUE:  Advised her numbness and tingling should improve with time If ganglion cyst does not improve, she will follow-up with Ortho for referral to hand surgeon  Return precautions discussed Nicki Reaper, NP This visit occurred during the SARS-CoV-2 public health emergency.  Safety protocols were in place, including screening questions prior to the visit, additional usage of staff PPE, and extensive  cleaning of exam room while observing appropriate contact time as indicated for disinfecting solutions.

## 2020-07-27 ENCOUNTER — Other Ambulatory Visit: Payer: Self-pay | Admitting: Internal Medicine

## 2020-07-28 NOTE — Telephone Encounter (Signed)
Last filled 04/05/2020.... please advise  

## 2020-08-01 MED ORDER — ALPRAZOLAM 0.5 MG PO TABS: 0.5000 mg | ORAL_TABLET | Freq: Every day | ORAL | 0 refills | Status: DC | PRN

## 2020-08-15 ENCOUNTER — Other Ambulatory Visit: Payer: Self-pay | Admitting: Internal Medicine

## 2020-08-15 MED ORDER — PREDNISONE 10 MG PO TABS: ORAL_TABLET | ORAL | 0 refills | Status: DC

## 2020-08-30 ENCOUNTER — Other Ambulatory Visit: Payer: Self-pay | Admitting: Internal Medicine

## 2020-08-30 MED ORDER — PREDNISONE 20 MG PO TABS: 40.0000 mg | ORAL_TABLET | Freq: Every day | ORAL | 0 refills | Status: AC

## 2020-09-01 ENCOUNTER — Other Ambulatory Visit: Payer: Self-pay

## 2020-09-01 ENCOUNTER — Encounter: Payer: Self-pay | Admitting: Internal Medicine

## 2020-09-01 ENCOUNTER — Telehealth: Payer: Self-pay | Admitting: Internal Medicine

## 2020-09-01 VITALS — BP 106/60 | HR 67 | Temp 98.2°F | Resp 17 | Ht 64.0 in | Wt 140.6 lb

## 2020-09-01 DIAGNOSIS — L237 Allergic contact dermatitis due to plants, except food: Secondary | ICD-10-CM

## 2020-09-01 MED ORDER — TRIAMCINOLONE ACETONIDE 40 MG/ML IJ SUSP
40.0000 mg | Freq: Once | INTRAMUSCULAR | Status: AC
  Administered 2020-09-01: 40 mg via INTRAMUSCULAR

## 2020-09-01 NOTE — Patient Instructions (Signed)
Contact Dermatitis Dermatitis is redness, soreness, and swelling (inflammation) of the skin. Contact dermatitis is a reaction to something that touches the skin. There are two types of contact dermatitis:  Irritant contact dermatitis. This happens when something bothers (irritates) your skin, like soap.  Allergic contact dermatitis. This is caused when you are exposed to something that you are allergic to, such as poison ivy. What are the causes?  Common causes of irritant contact dermatitis include: ? Makeup. ? Soaps. ? Detergents. ? Bleaches. ? Acids. ? Metals, such as nickel.  Common causes of allergic contact dermatitis include: ? Plants. ? Chemicals. ? Jewelry. ? Latex. ? Medicines. ? Preservatives in products, such as clothing. What increases the risk?  Having a job that exposes you to things that bother your skin.  Having asthma or eczema. What are the signs or symptoms? Symptoms may happen anywhere the irritant has touched your skin. Symptoms include:  Dry or flaky skin.  Redness.  Cracks.  Itching.  Pain or a burning feeling.  Blisters.  Blood or clear fluid draining from skin cracks. With allergic contact dermatitis, swelling may occur. This may happen in places such as the eyelids, mouth, or genitals.   How is this treated?  This condition is treated by checking for the cause of the reaction and protecting your skin. Treatment may also include: ? Steroid creams, ointments, or medicines. ? Antibiotic medicines or other ointments, if you have a skin infection. ? Lotion or medicines to help with itching. ? A bandage (dressing). Follow these instructions at home: Skin care  Moisturize your skin as needed.  Put cool cloths on your skin.  Put a baking soda paste on your skin. Stir water into baking soda until it looks like a paste.  Do not scratch your skin.  Avoid having things rub up against your skin.  Avoid the use of soaps, perfumes, and  dyes. Medicines  Take or apply over-the-counter and prescription medicines only as told by your doctor.  If you were prescribed an antibiotic medicine, take or apply it as told by your doctor. Do not stop using it even if your condition starts to get better. Bathing  Take a bath with: ? Epsom salts. ? Baking soda. ? Colloidal oatmeal.  Bathe less often.  Bathe in warm water. Avoid using hot water. Bandage care  If you were given a bandage, change it as told by your health care provider.  Wash your hands with soap and water before and after you change your bandage. If soap and water are not available, use hand sanitizer. General instructions  Avoid the things that caused your reaction. If you do not know what caused it, keep a journal. Write down: ? What you eat. ? What skin products you use. ? What you drink. ? What you wear in the area that has symptoms. This includes jewelry.  Check the affected areas every day for signs of infection. Check for: ? More redness, swelling, or pain. ? More fluid or blood. ? Warmth. ? Pus or a bad smell.  Keep all follow-up visits as told by your doctor. This is important. Contact a doctor if:  You do not get better with treatment.  Your condition gets worse.  You have signs of infection, such as: ? More swelling. ? Tenderness. ? More redness. ? Soreness. ? Warmth.  You have a fever.  You have new symptoms. Get help right away if:  You have a very bad headache.  You have   neck pain.  Your neck is stiff.  You throw up (vomit).  You feel very sleepy.  You see red streaks coming from the area.  Your bone or joint near the area hurts after the skin has healed.  The area turns darker.  You have trouble breathing. Summary  Dermatitis is redness, soreness, and swelling of the skin.  Symptoms may occur where the irritant has touched you.  Treatment may include medicines and skin care.  If you do not know what caused  your reaction, keep a journal.  Contact a doctor if your condition gets worse or you have signs of infection. This information is not intended to replace advice given to you by your health care provider. Make sure you discuss any questions you have with your health care provider. Document Revised: 07/08/2018 Document Reviewed: 10/01/2017 Elsevier Patient Education  2021 [employer] Inc.  

## 2020-09-01 NOTE — Progress Notes (Signed)
Subjective:    Patient ID: Lindsay Collins, female    DOB: 09-26-1985, 35 y.o.   MRN: [id number]  HPI  Pt presents to the clinic today wit h c/o a rash. This started a few weeks ago. The rash is located on her arms, legs and around her mouth.  The rash itches and burns.  She has been using steroid cream and is currently on her second dose of Prednisone which has not provided much relief.  She denies changes in soaps, lotions or detergents.  No one in her home has a similar rash.  Review of Systems      Past Medical History:  Diagnosis Date  . History of shingles 02/22/2019  . Nephrolithiasis     Current Outpatient Medications  Medication Sig Dispense Refill  . ALPRAZolam (XANAX) 0.5 MG tablet Take 1 tablet (0.5 mg total) by mouth daily as needed for anxiety. 30 tablet 0  . amoxicillin (AMOXIL) 500 MG capsule Take 1 capsule (500 mg total) by mouth 3 (three) times daily. 30 capsule 0  . HYDROcodone-homatropine (HYCODAN) 5-1.5 MG/5ML syrup Take 5 mLs by mouth every 8 (eight) hours as needed for cough. 120 mL 0  . predniSONE (DELTASONE) 20 MG tablet Take 2 tablets (40 mg total) by mouth daily with breakfast for 5 days. 10 tablet 0  . SUMAtriptan (IMITREX) 25 MG tablet TAKE 1 TAB BY MOUTH EVERY 2HRS AS NEEDED FOR MIRGRAINE MAY REPEAT IN 2HRS IF HEADACHE PERSISTS 10 tablet 1   No current facility-administered medications for this visit.    No Known Allergies  Family History  Problem Relation Age of Onset  . COPD Mother   . Breast cancer Mother 11  . COPD Father   . Cancer Father        prostate    Social History   Socioeconomic History  . Marital status: Single    Spouse name: Not on file  . Number of children: Not on file  . Years of education: Not on file  . Highest education level: Not on file  Occupational History  . Not on file  Tobacco Use  . Smoking status: Never Smoker  . Smokeless tobacco: Never Used  Substance and Sexual Activity  . Alcohol use: No     Alcohol/week: 0.0 standard drinks  . Drug use: No  . Sexual activity: Yes  Other Topics Concern  . Not on file  Social History Narrative  . Not on file   Social Determinants of Health   Financial Resource Strain: Not on file  Food Insecurity: Not on file  Transportation Needs: Not on file  Physical Activity: Not on file  Stress: Not on file  Social Connections: Not on file  Intimate Partner Violence: Not on file     Constitutional: Denies fever, malaise, fatigue, headache or abrupt weight changes.  Respiratory: Denies difficulty breathing, shortness of breath, cough or sputum production.   Cardiovascular: Denies chest pain, chest tightness, palpitations or swelling in the hands or feet.  Skin: Patient reports rash.  Denies lesions or ulcercations.   No other specific complaints in a complete review of systems (except as listed in HPI above).  Objective:   Physical Exam BP 106/60 (BP Location: Right Arm, Patient Position: Sitting, Cuff Size: Normal)   Pulse 67   Temp 98.2 F (36.8 C) (Temporal)   Resp 17   Ht 5\' 4"  (1.626 m)   Wt 140 lb 9.6 oz (63.8 kg)   SpO2 99%  BMI 24.13 kg/m   Wt Readings from Last 3 Encounters:  07/13/20 138 lb 12.8 oz (63 kg)  03/17/20 139 lb (63 kg)  03/18/19 129 lb (58.5 kg)    General: Appears her stated age, well developed, well nourished in NAD. Skin: Grouped maculopapular rash on erythematous base noted in the perioral area, bilateral antecubital fossa's and right calf. HEENT: Head: normal shape and size; Eyes: sclera whiteand EOMs intact;  Cardiovascular: Normal rate and rhythm.  Pulmonary/Chest: Normal effort and positive vesicular breath sounds. Musculoskeletal: No difficulty with gait.  Neurological: Alert and oriented.  Psychiatric: Mood and affect normal. Behavior is normal. Judgment and thought content normal.     BMET    Component Value Date/Time   NA 139 03/17/2020 1430   NA 138 09/23/2013 0536   K 4.3 03/17/2020  1430   K 3.9 09/23/2013 0536   CL 103 03/17/2020 1430   CL 105 09/23/2013 0536   CO2 26 03/17/2020 1430   CO2 29 09/23/2013 0536   GLUCOSE 95 03/17/2020 1430   GLUCOSE 131 (H) 09/23/2013 0536   BUN 15 03/17/2020 1430   BUN 13 09/23/2013 0536   CREATININE 0.91 03/17/2020 1430   CALCIUM 9.6 03/17/2020 1430   CALCIUM 8.5 09/23/2013 0536   GFRNONAA >60 09/23/2013 0536   GFRAA >60 09/23/2013 0536    Lipid Panel     Component Value Date/Time   CHOL 153 03/17/2020 1430   TRIG 61 03/17/2020 1430   HDL 69 03/17/2020 1430   CHOLHDL 2.2 03/17/2020 1430   VLDL 12.8 03/18/2019 1550   LDLCALC 70 03/17/2020 1430    CBC    Component Value Date/Time   WBC 7.5 03/17/2020 1430   RBC 3.89 03/17/2020 1430   HGB 12.6 03/17/2020 1430   HGB 10.0 (L) 09/23/2013 0536   HCT 36.1 03/17/2020 1430   HCT 28.8 (L) 09/23/2013 0536   PLT 214 03/17/2020 1430   PLT 138 (L) 09/23/2013 0536   MCV 92.8 03/17/2020 1430   MCV 93 09/23/2013 0536   MCH 32.4 03/17/2020 1430   MCHC 34.9 03/17/2020 1430   RDW 12.0 03/17/2020 1430   RDW 13.6 09/23/2013 0536   LYMPHSABS 1.4 06/11/2013 0939   LYMPHSABS 1.9 02/25/2012 1424   MONOABS 0.3 06/11/2013 0939   MONOABS 0.4 02/25/2012 1424   EOSABS 0.0 06/11/2013 0939   EOSABS 0.1 02/25/2012 1424   BASOSABS 0.0 06/11/2013 0939   BASOSABS 0.0 02/25/2012 1424    Hgb A1C No results found for: HGBA1C        Assessment & Plan:  Contact dermatitis:  Appears consistent with poison ivy Continue prednisone taper as prescribed 40 mg Triamcinolone IM today Start Benadryl 25 to 50 mg p.o. nightly x5 days Start Famotidine 10 to 20 mg p.o. nightly x5 days If persist will need allergy referral  Return precautions discussed  Nicki Reaper, NP This visit occurred during the SARS-CoV-2 public health emergency.  Safety protocols were in place, including screening questions prior to the visit, additional usage of staff PPE, and extensive cleaning of exam room while  observing appropriate contact time as indicated for disinfecting solutions.

## 2020-09-03 ENCOUNTER — Encounter: Payer: Self-pay | Admitting: Internal Medicine

## 2020-10-26 ENCOUNTER — Other Ambulatory Visit: Payer: Self-pay | Admitting: Internal Medicine

## 2020-11-07 ENCOUNTER — Other Ambulatory Visit: Payer: Self-pay | Admitting: Internal Medicine

## 2020-11-08 MED ORDER — ALPRAZOLAM 0.5 MG PO TABS: 0.5000 mg | ORAL_TABLET | Freq: Every day | ORAL | 0 refills | Status: DC | PRN

## 2021-01-01 ENCOUNTER — Ambulatory Visit: Payer: Self-pay | Admitting: *Deleted

## 2021-01-01 NOTE — Telephone Encounter (Signed)
Patient is requesting medication for poison oak- the cream she is using is not helping the itching and rash. Patient states her provider is aware of her reaction and she is requesting medication. Patient uses [facility], [place]   Summary: rash on arms   The patient would like to be contacted regarding a rash on both of their arms   The patient developed the rash after being outside yesterday 12/31/20   Please contact further when available   The patient shares that they would also like to speak with their PCP if possible      Reason for Disposition  MODERATE to SEVERE itching (e.g., interferes with work, school, sleep, or other activities)  Answer Assessment - Initial Assessment Questions 1. APPEARANCE of RASH: "Describe the rash."      Raised bumps, splotches  2. LOCATION: "Where is the rash located?"      Bilateral arms- left is worse- forearms and top of arms as well 3. NUMBER: "How many spots are there?"      multiple 4. SIZE: "How big are the spots?" (Inches, centimeters or compare to size of a coin)      Small bumps- red areas 5. ONSET: "When did the rash start?"      Yesterday after shower 6. ITCHING: "Does the rash itch?" If Yes, ask: "How bad is the itch?"  (Scale 0-10; or none, mild, moderate, severe)     Yes- 7-8 7. PAIN: "Does the rash hurt?" If Yes, ask: "How bad is the pain?"  (Scale 0-10; or none, mild, moderate, severe)    - NONE (0): no pain    - MILD (1-3): doesn't interfere with normal activities     - MODERATE (4-7): interferes with normal activities or awakens from sleep     - SEVERE (8-10): excruciating pain, unable to do any normal activities     no 8. OTHER SYMPTOMS: "Do you have any other symptoms?" (e.g., fever)     no 9. PREGNANCY: "Is there any chance you are pregnant?" "When was your last menstrual period?"     N/a  Protocols used: Rash or Redness - Localized-A-AH, Poison Ivy - Oak - [facility]

## 2021-01-02 ENCOUNTER — Telehealth: Payer: Self-pay | Admitting: Internal Medicine

## 2021-01-02 ENCOUNTER — Telehealth: Payer: Self-pay

## 2021-01-02 MED ORDER — PREDNISONE 10 MG PO TABS: ORAL_TABLET | ORAL | 0 refills | Status: DC

## 2021-01-02 NOTE — Telephone Encounter (Signed)
The pt was notified of this. No questions or concerns.

## 2021-01-02 NOTE — Telephone Encounter (Signed)
Rx for prednisone sent to pharmacy.  

## 2021-01-02 NOTE — Telephone Encounter (Signed)
The prednisone will do more than a topical cream.  She is also tried topical cream with minimal relief of symptoms.  She has prescription topical creams that I have given her in the past.

## 2021-01-02 NOTE — Telephone Encounter (Signed)
I called the patient and informed her that [facility] sent over Prednisone for to treat the poison ivy. She wanted to know if there is a cream she can use as well. Please advise

## 2021-01-02 NOTE — Telephone Encounter (Signed)
Pts poison ivy rash is spreading to her face and she asked for medication to be sent to pharmacy for it.yesterday and hasnt heard anything back/ please advise asap

## 2021-01-02 NOTE — Addendum Note (Signed)
Addended by: Lorre Munroe on: 01/02/2021 09:47 AM   Modules accepted: Orders

## 2021-01-02 NOTE — Telephone Encounter (Signed)
Copied from CRM [id number]. Topic: Quick Communication - Rx Refill/Question >> Jan 01, 2021  5:12 PM Pawlus, Maxine Glenn A wrote: Pt called in to follow up on her request for poison oak medication, pt wanted to know when this will be sent in.

## 2021-02-12 ENCOUNTER — Encounter: Payer: Self-pay | Admitting: Internal Medicine

## 2021-02-12 ENCOUNTER — Ambulatory Visit: Payer: BLUE CROSS/BLUE SHIELD | Admitting: Internal Medicine

## 2021-02-12 ENCOUNTER — Other Ambulatory Visit: Payer: Self-pay

## 2021-02-12 VITALS — BP 105/73 | HR 79 | Temp 98.7°F | Resp 18 | Ht 64.0 in | Wt 142.2 lb

## 2021-02-12 DIAGNOSIS — J00 Acute nasopharyngitis [common cold]: Secondary | ICD-10-CM

## 2021-02-12 MED ORDER — AZITHROMYCIN 250 MG PO TABS: ORAL_TABLET | ORAL | 0 refills | Status: DC

## 2021-02-12 MED ORDER — HYDROCODONE BIT-HOMATROP MBR 5-1.5 MG/5ML PO SOLN: 5.0000 mL | Freq: Three times a day (TID) | ORAL | 0 refills | Status: DC | PRN

## 2021-02-12 NOTE — Patient Instructions (Signed)

## 2021-02-12 NOTE — Progress Notes (Signed)
HPI  Pt presents to the clinic today with c/o headache, nasal congestion, right ear pain, sore throat, and cough. This started 3 days ago. The headache is located all over. She is blowing green mucous out of her nose. The ear pain has improved slightly, she denies drainage or loss of hearing. She denies difficulty swallowing. The cough is productive of green mucous. She denies fever, but has had chills. She has not had sick contacts or exposure to covid/flu that she is aware of. She has taken Mucinex, Tylenol and Nyquil with minimal relief of symptoms. She is up to date on Covid and flu vaccines.  Review of Systems      Past Medical History:  Diagnosis Date   History of shingles 02/22/2019   Nephrolithiasis     Family History  Problem Relation Age of Onset   COPD Mother    Breast cancer Mother 41   COPD Father    Cancer Father        prostate    Social History   Socioeconomic History   Marital status: Single    Spouse name: Not on file   Number of children: Not on file   Years of education: Not on file   Highest education level: Not on file  Occupational History   Not on file  Tobacco Use   Smoking status: Never   Smokeless tobacco: Never  Substance and Sexual Activity   Alcohol use: No    Alcohol/week: 0.0 standard drinks   Drug use: No   Sexual activity: Yes  Other Topics Concern   Not on file  Social History Narrative   Not on file   Social Determinants of Health   Financial Resource Strain: Not on file  Food Insecurity: Not on file  Transportation Needs: Not on file  Physical Activity: Not on file  Stress: Not on file  Social Connections: Not on file  Intimate Partner Violence: Not on file    No Known Allergies   Constitutional: Positive headache, fatigue. Denies fever or abrupt weight changes.  HEENT:  Positive nasal congestion, ear pain and sore throat. Denies eye redness, eye pain, pressure behind the eyes, facial pain, ringing in the ears, wax  buildup, runny nose or bloody nose. Respiratory: Positive cough. Denies difficulty breathing or shortness of breath.  Cardiovascular: Denies chest pain, chest tightness, palpitations or swelling in the hands or feet.   No other specific complaints in a complete review of systems (except as listed in HPI above).  Objective:   BP 105/73 (BP Location: Right Arm, Patient Position: Sitting, Cuff Size: Normal)   Pulse 79   Temp 98.7 F (37.1 C) (Temporal)   Resp 18   Ht 5\' 4"  (1.626 m)   Wt 142 lb 3.2 oz (64.5 kg)   SpO2 100%   BMI 24.41 kg/m   Wt Readings from Last 3 Encounters:  09/01/20 140 lb 9.6 oz (63.8 kg)  07/13/20 138 lb 12.8 oz (63 kg)  03/17/20 139 lb (63 kg)     General: Appears her stated age, ill appearing but in NAD. HEENT: Head: normal shape and size, no sinus tenderness noted; Eyes: sclera white, no icterus, conjunctiva pink; Right Ear: Tm's red and intact, distorted light reflex;  Throat/Mouth: + PND. Teeth present, mucosa erythematous and moist, no exudate noted, no lesions or ulcerations noted.  Neck: No cervical lymphadenopathy.  Cardiovascular: Normal rate and rhythm. S1,S2 noted.  No murmur, rubs or gallops noted.  Pulmonary/Chest: Normal effort and positive  vesicular breath sounds. No respiratory distress. No wheezes, rales or ronchi noted.       Assessment & Plan:   Upper Respiratory Infection:  Get some rest and drink plenty of water Do salt water gargles for the sore throat eRx for Azithromax x 5 days eRx for Hycodan cough syrup  RTC as needed or if symptoms persist.   Nicki Reaper, NP This visit occurred during the SARS-CoV-2 public health emergency.  Safety protocols were in place, including screening questions prior to the visit, additional usage of staff PPE, and extensive cleaning of exam room while observing appropriate contact time as indicated for disinfecting solutions.

## 2021-02-13 ENCOUNTER — Other Ambulatory Visit: Payer: Self-pay | Admitting: Internal Medicine

## 2021-02-13 MED ORDER — ONDANSETRON 4 MG PO TBDP: 4.0000 mg | ORAL_TABLET | Freq: Three times a day (TID) | ORAL | 0 refills | Status: DC | PRN

## 2021-03-19 ENCOUNTER — Ambulatory Visit: Payer: BLUE CROSS/BLUE SHIELD | Admitting: Internal Medicine

## 2021-03-19 ENCOUNTER — Encounter: Payer: Self-pay | Admitting: Internal Medicine

## 2021-03-19 ENCOUNTER — Other Ambulatory Visit: Payer: Self-pay

## 2021-03-19 VITALS — BP 99/60 | HR 80 | Temp 98.0°F | Resp 18 | Ht 64.0 in | Wt 140.4 lb

## 2021-03-19 DIAGNOSIS — F419 Anxiety disorder, unspecified: Secondary | ICD-10-CM | POA: Diagnosis not present

## 2021-03-19 DIAGNOSIS — R519 Headache, unspecified: Secondary | ICD-10-CM | POA: Diagnosis not present

## 2021-03-19 DIAGNOSIS — F5104 Psychophysiologic insomnia: Secondary | ICD-10-CM

## 2021-03-19 DIAGNOSIS — Z0001 Encounter for general adult medical examination with abnormal findings: Secondary | ICD-10-CM | POA: Diagnosis not present

## 2021-03-19 MED ORDER — ALPRAZOLAM 0.5 MG PO TABS: 0.5000 mg | ORAL_TABLET | Freq: Every day | ORAL | 0 refills | Status: DC | PRN

## 2021-03-19 MED ORDER — SUMATRIPTAN SUCCINATE 25 MG PO TABS: ORAL_TABLET | ORAL | 2 refills | Status: AC

## 2021-03-19 NOTE — Patient Instructions (Signed)

## 2021-03-19 NOTE — Assessment & Plan Note (Signed)
Continue Xanax as needed, refilled today Support offered

## 2021-03-19 NOTE — Assessment & Plan Note (Signed)
Trazodone too sedating She has failed Melatonin in the past We will monitor

## 2021-03-19 NOTE — Progress Notes (Signed)
Subjective:    Patient ID: Lindsay Collins, female    DOB: [birth year], 35 y.o.   MRN: [id number]  HPI  Patient presents the clinic today for her annual exam.  She is also due to follow-up chronic conditions.  Anxiety: Intermittent.  She takes Xanax as needed with good relief of symptoms.  She is not currently seeing a therapist.  She denies depression, SI/HI.  Frequent Headaches: These occur 2-3 times per month.  She takes Imitrex as needed with good relief of symptoms.  She does not follow with neurology.  Insomnia: She has difficulty falling asleep.  She is not currently taking any medication for this.  There is no sleep study on file.  Flu: 12/2020 Tetanus: 10/2018 COVID: Pfizer x 2 Pap smear: 10/2017, hysterectomy Dentist: Biannually  Diet: She does eat meat. She consumes fruits and veggies. She does eat some fried foods. She drinks mostly water, tea and soda. Exercise: None  Review of Systems     Past Medical History:  Diagnosis Date   History of shingles 02/22/2019   Nephrolithiasis     Current Outpatient Medications  Medication Sig Dispense Refill   acetaminophen (TYLENOL) 500 MG tablet Take 500 mg by mouth every 6 (six) hours as needed.     ALPRAZolam (XANAX) 0.5 MG tablet Take 1 tablet (0.5 mg total) by mouth daily as needed for anxiety. 30 tablet 0   azithromycin (ZITHROMAX) 250 MG tablet Take 2 tabs today, then 1 tab daily x 4 days 6 tablet 0   dextromethorphan-guaiFENesin (MUCINEX DM) 30-600 MG 12hr tablet Take 1 tablet by mouth 2 (two) times daily.     DM-Doxylamine-Acetaminophen (NYQUIL COLD & FLU PO) Take by mouth.     HYDROcodone bit-homatropine (HYCODAN) 5-1.5 MG/5ML syrup Take 5 mLs by mouth every 8 (eight) hours as needed for cough. 120 mL 0   ondansetron (ZOFRAN ODT) 4 MG disintegrating tablet Take 1 tablet (4 mg total) by mouth every 8 (eight) hours as needed for nausea or vomiting. 30 tablet 0   SUMAtriptan (IMITREX) 25 MG tablet TAKE 1 TAB BY MOUTH  EVERY 2HRS AS NEEDED FOR MIRGRAINE MAY REPEAT IN 2HRS IF HEADACHE PERSISTS 10 tablet 1   No current facility-administered medications for this visit.    No Known Allergies  Family History  Problem Relation Age of Onset   COPD Mother    Breast cancer Mother 73   COPD Father    Cancer Father        prostate    Social History   Socioeconomic History   Marital status: Single    Spouse name: Not on file   Number of children: Not on file   Years of education: Not on file   Highest education level: Not on file  Occupational History   Not on file  Tobacco Use   Smoking status: Never   Smokeless tobacco: Never  Substance and Sexual Activity   Alcohol use: No    Alcohol/week: 0.0 standard drinks   Drug use: No   Sexual activity: Yes  Other Topics Concern   Not on file  Social History Narrative   Not on file   Social Determinants of Health   Financial Resource Strain: Not on file  Food Insecurity: Not on file  Transportation Needs: Not on file  Physical Activity: Not on file  Stress: Not on file  Social Connections: Not on file  Intimate Partner Violence: Not on file     Constitutional: Patient reports intermittent  headaches.  Denies fever, malaise, fatigue, or abrupt weight changes.  HEENT: Denies eye pain, eye redness, ear pain, ringing in the ears, wax buildup, runny nose, nasal congestion, bloody nose, or sore throat. Respiratory: Denies difficulty breathing, shortness of breath, cough or sputum production.   Cardiovascular: Denies chest pain, chest tightness, palpitations or swelling in the hands or feet.  Gastrointestinal: Denies abdominal pain, bloating, constipation, diarrhea or blood in the stool.  GU: Denies urgency, frequency, pain with urination, burning sensation, blood in urine, odor or discharge. Musculoskeletal: Denies decrease in range of motion, difficulty with gait, muscle pain or joint pain and swelling.  Skin: Denies redness, rashes, lesions or  ulcercations.  Neurological: Patient reports insomnia.  Denies dizziness, difficulty with memory, difficulty with speech or problems with balance and coordination.  Psych: Patient has a history of anxiety.  Denies depression, SI/HI.  No other specific complaints in a complete review of systems (except as listed in HPI above).  Objective:   Physical Exam   BP 99/60 (BP Location: Right Arm, Patient Position: Sitting, Cuff Size: Normal)    Pulse 80    Temp 98 F (36.7 C) (Temporal)    Resp 18    Ht '5\' 4"'  (1.626 m)    Wt 140 lb 6.4 oz (63.7 kg)    SpO2 100%    BMI 24.10 kg/m   Wt Readings from Last 3 Encounters:  02/12/21 142 lb 3.2 oz (64.5 kg)  09/01/20 140 lb 9.6 oz (63.8 kg)  07/13/20 138 lb 12.8 oz (63 kg)    General: Appears her stated age, well developed, well nourished in NAD. Skin: Warm, dry and intact.  HEENT: Head: normal shape and size; Eyes: sclera white and EOMs intact;  Neck:  Neck supple, trachea midline. No masses, lumps or thyromegaly present.  Cardiovascular: Normal rate and rhythm. S1,S2 noted.  No murmur, rubs or gallops noted. No JVD or BLE edema.  Pulmonary/Chest: Normal effort and positive vesicular breath sounds. No respiratory distress. No wheezes, rales or ronchi noted.  Abdomen: Normal bowel sounds Musculoskeletal: Strength 5/5 BUE/BLE. No difficulty with gait.  Neurological: Alert and oriented. Cranial nerves II-XII grossly intact. Coordination normal.  Psychiatric: Mood and affect normal. Behavior is normal. Judgment and thought content normal.    BMET    Component Value Date/Time   NA 139 03/17/2020 1430   NA 138 09/23/2013 0536   K 4.3 03/17/2020 1430   K 3.9 09/23/2013 0536   CL 103 03/17/2020 1430   CL 105 09/23/2013 0536   CO2 26 03/17/2020 1430   CO2 29 09/23/2013 0536   GLUCOSE 95 03/17/2020 1430   GLUCOSE 131 (H) 09/23/2013 0536   BUN 15 03/17/2020 1430   BUN 13 09/23/2013 0536   CREATININE 0.91 03/17/2020 1430   CALCIUM 9.6 03/17/2020  1430   CALCIUM 8.5 09/23/2013 0536   GFRNONAA >60 09/23/2013 0536   GFRAA >60 09/23/2013 0536    Lipid Panel     Component Value Date/Time   CHOL 153 03/17/2020 1430   TRIG 61 03/17/2020 1430   HDL 69 03/17/2020 1430   CHOLHDL 2.2 03/17/2020 1430   VLDL 12.8 03/18/2019 1550   LDLCALC 70 03/17/2020 1430    CBC    Component Value Date/Time   WBC 7.5 03/17/2020 1430   RBC 3.89 03/17/2020 1430   HGB 12.6 03/17/2020 1430   HGB 10.0 (L) 09/23/2013 0536   HCT 36.1 03/17/2020 1430   HCT 28.8 (L) 09/23/2013 0536   PLT  214 03/17/2020 1430   PLT 138 (L) 09/23/2013 0536   MCV 92.8 03/17/2020 1430   MCV 93 09/23/2013 0536   MCH 32.4 03/17/2020 1430   MCHC 34.9 03/17/2020 1430   RDW 12.0 03/17/2020 1430   RDW 13.6 09/23/2013 0536   LYMPHSABS 1.4 06/11/2013 0939   LYMPHSABS 1.9 02/25/2012 1424   MONOABS 0.3 06/11/2013 0939   MONOABS 0.4 02/25/2012 1424   EOSABS 0.0 06/11/2013 0939   EOSABS 0.1 02/25/2012 1424   BASOSABS 0.0 06/11/2013 0939   BASOSABS 0.0 02/25/2012 1424    Hgb A1C No results found for: HGBA1C         Assessment & Plan:   Preventative Health Maintenance:  Flu shot  UTD Tetanus UTD Encouraged her to get a COVID booster She no longer needs Pap smears Encouraged her to consume a balanced diet and exercise regimen Advised her to see a dentist annually We will check CBC, c-Met, lipid profile today  RTC in 1 year, sooner if needed  Webb Silversmith, NP This visit occurred during the SARS-CoV-2 public health emergency.  Safety protocols were in place, including screening questions prior to the visit, additional usage of staff PPE, and extensive cleaning of exam room while observing appropriate contact time as indicated for disinfecting solutions.

## 2021-03-19 NOTE — Assessment & Plan Note (Signed)
Stable on her current dose of Imitrex, refilled today Will monitor

## 2021-03-20 LAB — COMPLETE METABOLIC PANEL WITH GFR
AG Ratio: 2.1 (calc) (ref 1.0–2.5)
ALT: 9 U/L (ref 6–29)
AST: 12 U/L (ref 10–30)
Albumin: 4.5 g/dL (ref 3.6–5.1)
Alkaline phosphatase (APISO): 50 U/L (ref 31–125)
BUN: 13 mg/dL (ref 7–25)
CO2: 28 mmol/L (ref 20–32)
Calcium: 9.2 mg/dL (ref 8.6–10.2)
Chloride: 103 mmol/L (ref 98–110)
Creat: 0.83 mg/dL (ref 0.50–0.97)
Globulin: 2.1 g/dL (calc) (ref 1.9–3.7)
Glucose, Bld: 72 mg/dL (ref 65–139)
Potassium: 3.8 mmol/L (ref 3.5–5.3)
Sodium: 140 mmol/L (ref 135–146)
Total Bilirubin: 0.7 mg/dL (ref 0.2–1.2)
Total Protein: 6.6 g/dL (ref 6.1–8.1)
eGFR: 94 mL/min/{1.73_m2} (ref >=60)

## 2021-03-20 LAB — CBC
HCT: 38.6 % (ref 35.0–45.0)
Hemoglobin: 13.3 g/dL (ref 11.7–15.5)
MCH: 32.4 pg (ref 27.0–33.0)
MCHC: 34.5 g/dL (ref 32.0–36.0)
MCV: 94.1 fL (ref 80.0–100.0)
MPV: 10 fL (ref 7.5–12.5)
Platelets: 189 10*3/uL (ref 140–400)
RBC: 4.1 10*6/uL (ref 3.80–5.10)
RDW: 11.9 % (ref 11.0–15.0)
WBC: 4.6 10*3/uL (ref 3.8–10.8)

## 2021-03-20 LAB — LIPID PANEL
Cholesterol: 166 mg/dL (ref <200)
HDL: 76 mg/dL (ref >=50)
LDL Cholesterol (Calc): 74 mg/dL (calc)
Non-HDL Cholesterol (Calc): 90 mg/dL (calc) (ref <130)
Total CHOL/HDL Ratio: 2.2 (calc) (ref <5.0)
Triglycerides: 78 mg/dL (ref <150)

## 2021-03-22 ENCOUNTER — Other Ambulatory Visit: Payer: Self-pay | Admitting: Internal Medicine

## 2021-03-22 MED ORDER — HYDROCODONE BIT-HOMATROP MBR 5-1.5 MG/5ML PO SOLN: 5.0000 mL | Freq: Three times a day (TID) | ORAL | 0 refills | Status: DC | PRN

## 2021-03-22 MED ORDER — PREDNISONE 10 MG PO TABS: ORAL_TABLET | ORAL | 0 refills | Status: DC

## 2021-03-30 ENCOUNTER — Other Ambulatory Visit: Payer: Self-pay | Admitting: Internal Medicine

## 2021-03-30 MED ORDER — PERMETHRIN 5 % EX CREA: TOPICAL_CREAM | CUTANEOUS | 1 refills | Status: AC

## 2021-04-25 ENCOUNTER — Other Ambulatory Visit: Payer: Self-pay | Admitting: Internal Medicine

## 2021-04-25 MED ORDER — PROMETHAZINE-DM 6.25-15 MG/5ML PO SYRP: 5.0000 mL | ORAL_SOLUTION | Freq: Four times a day (QID) | ORAL | 0 refills | Status: DC | PRN

## 2021-04-25 MED ORDER — AZITHROMYCIN 250 MG PO TABS: ORAL_TABLET | ORAL | 0 refills | Status: DC

## 2021-05-08 ENCOUNTER — Ambulatory Visit: Payer: Self-pay

## 2021-05-08 NOTE — Telephone Encounter (Signed)
Pt called, unable to leave VM d/t message of " call cant be completed at this time"...  Summary: pt wonders if contacted staph   Pt states that her son had gotten staph from wrestling,  She is a little concerned as she has since had a spot pop up on her face, now 2 spots on her chest and additional on her shoulder. She has never had acne at all and is wondering if she needs to be concerned at son's whole wrestling team had to be treated with antibiotic. [phone]

## 2021-05-09 NOTE — Telephone Encounter (Signed)
Advised pt that we don't have appointments this afternoon.  She states she is going to do a MyChart visit today.   Thanks,   -Vernona Rieger

## 2021-06-01 IMAGING — MG DIGITAL DIAGNOSTIC UNILATERAL RIGHT MAMMOGRAM WITH TOMO AND CAD
6 of 9 series · 6 of 21 positions shown · non-contrast
Comparison: Previous exam(s).

CLINICAL DATA: Recall from screening to evaluate possible right
breast microcalcifications as well as possible right breast
distortion. Family history breast cancer in her mother diagnosed in
her 30s.

EXAM:
DIGITAL DIAGNOSTIC UNILATERAL RIGHT MAMMOGRAM WITH CAD AND TOMO

[R CC (1 of 2)]
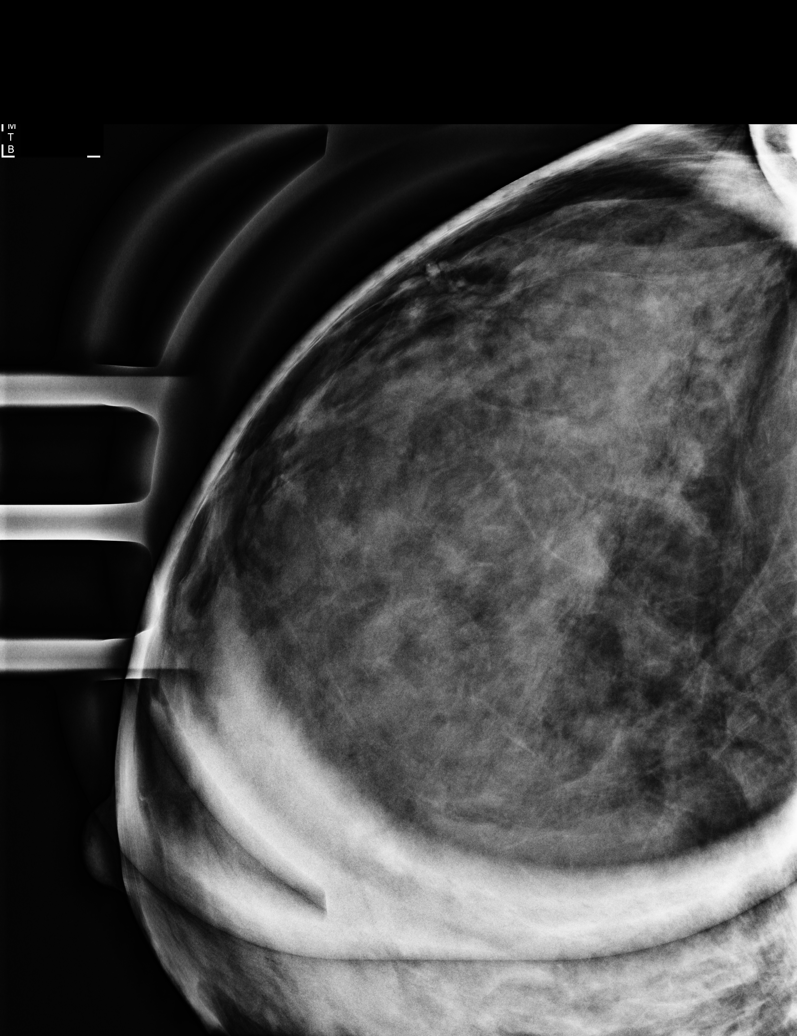

[R CC (2 of 2)]
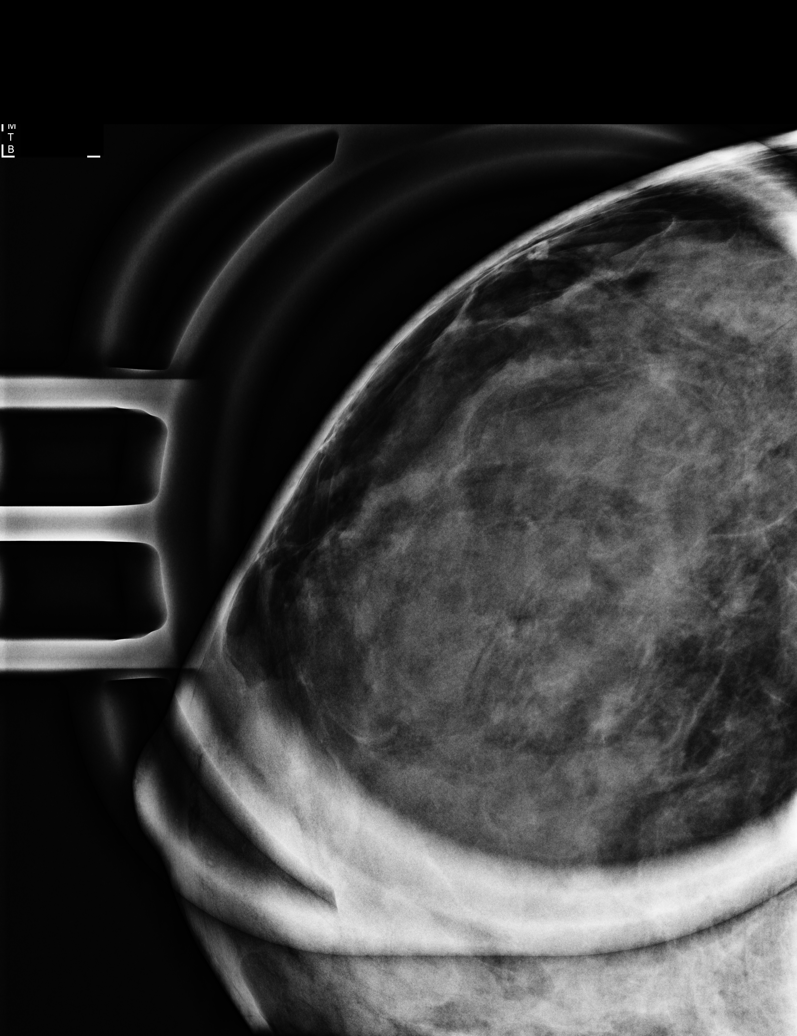

[R LM]
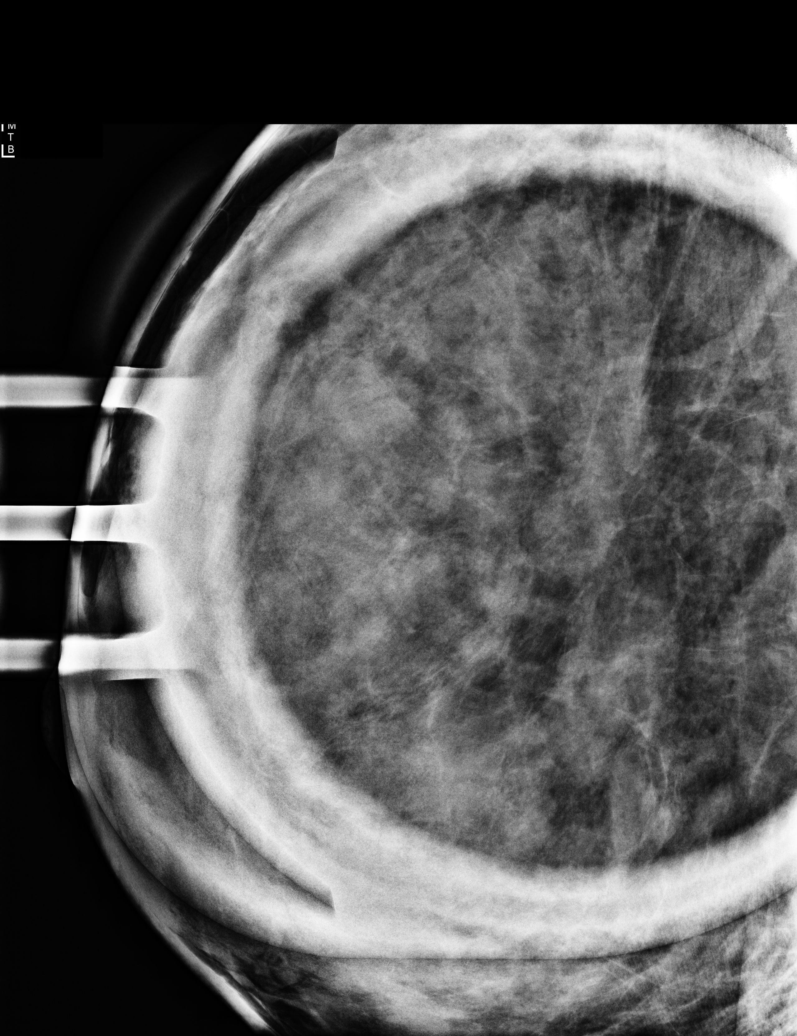

[R MLO synth-2D (1 of 2)]
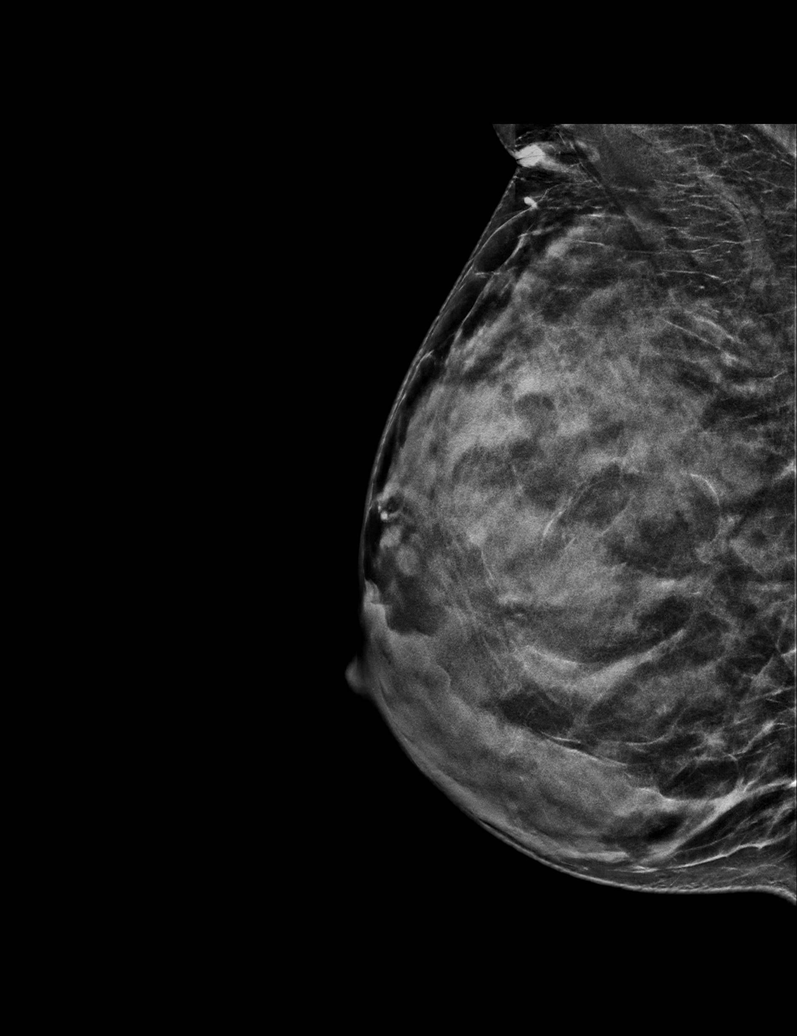

[R MLO synth-2D (2 of 2)]
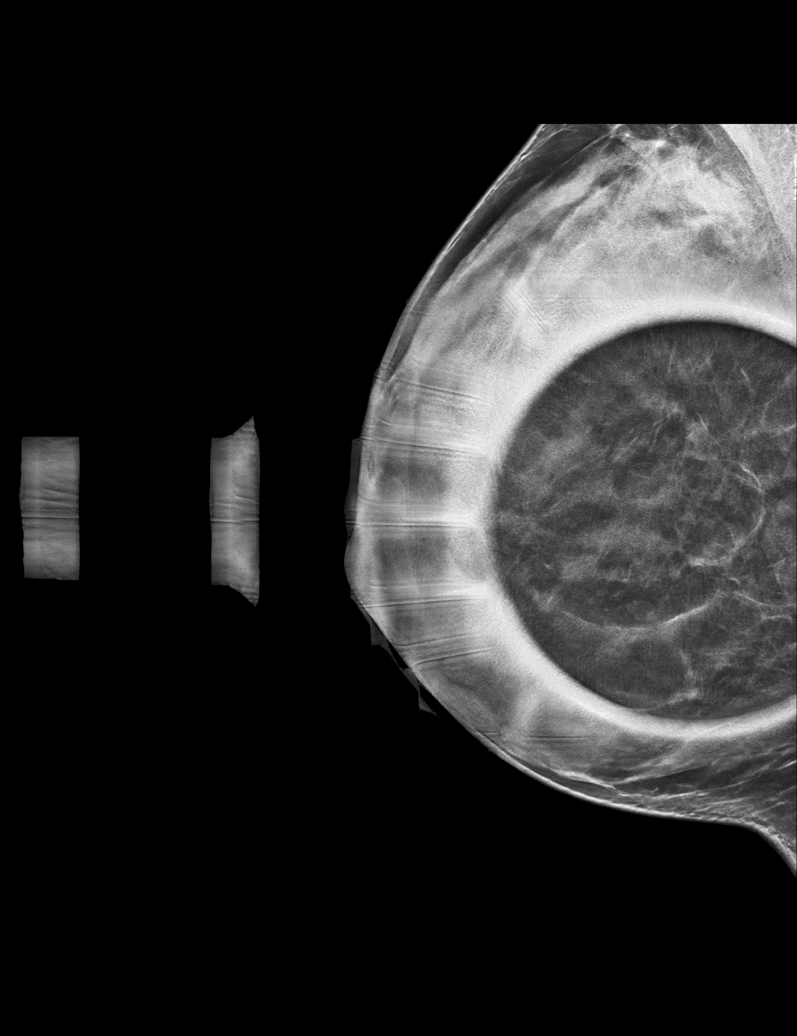

[R ML synth-2D]
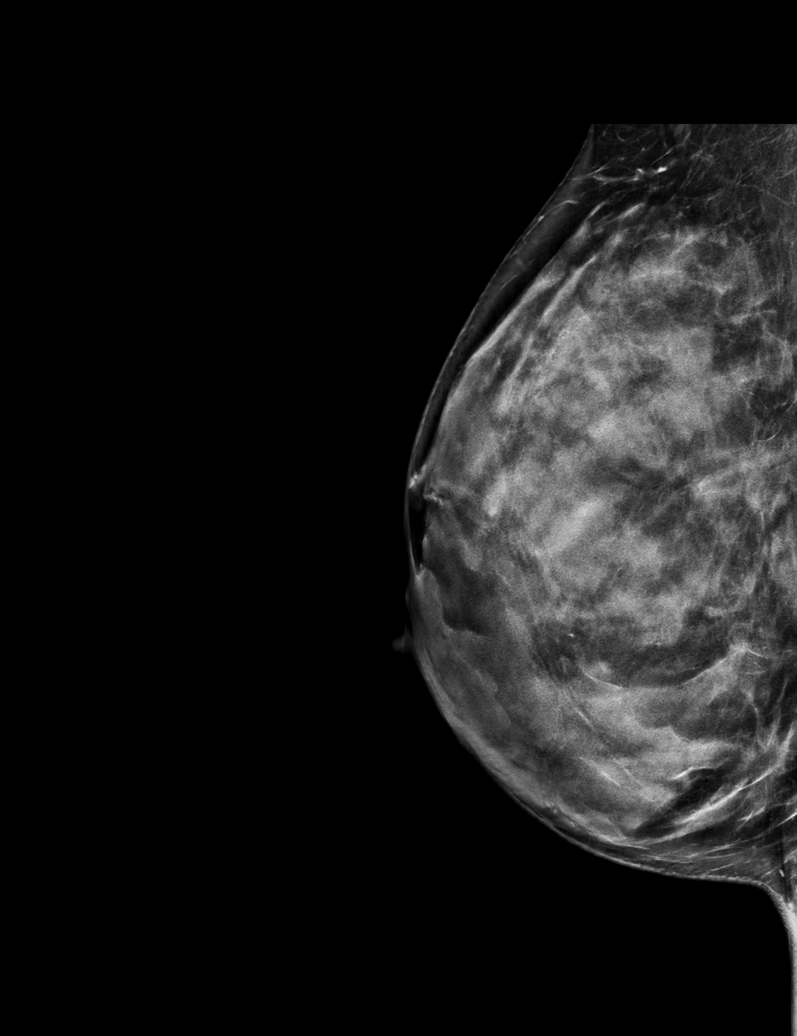

[6 of 21 positions shown; findings below may reference images not displayed]

ACR Breast Density Category d: The breast tissue is extremely dense,
which lowers the sensitivity of mammography.
FINDINGS: Multiple additional images were obtained. Additional Mag views
demonstrate no evidence of microcalcifications over the posterior
third of the outer midportion of the right breast. Multiple
additional images including repeat full paddle MLO view demonstrate
no focal distortion over the lower right breast.

Mammographic images were processed with CAD.
IMPRESSION: No significant microcalcifications over the right breast and no
evidence of focal right breast distortion.

RECOMMENDATION:
Recommend continued annual bilateral screening mammographic
follow-up in this patient with strong family history of breast
cancer. Also consider baseline high risk screening breast MRI due to
patient's strong family history.

I have discussed the findings and recommendations with the patient.
Results were also provided in writing at the conclusion of the
visit. If applicable, a reminder letter will be sent to the patient
regarding the next appointment.

BI-RADS CATEGORY  1: Negative.

## 2021-06-15 ENCOUNTER — Other Ambulatory Visit: Payer: Self-pay | Admitting: Internal Medicine

## 2021-06-15 MED ORDER — ALPRAZOLAM 0.5 MG PO TABS: 0.5000 mg | ORAL_TABLET | Freq: Every day | ORAL | 0 refills | Status: DC | PRN

## 2021-07-21 ENCOUNTER — Telehealth: Payer: Self-pay | Admitting: Nurse Practitioner

## 2021-07-21 MED ORDER — PREDNISONE 10 MG PO TABS: ORAL_TABLET | ORAL | 0 refills | Status: AC

## 2021-07-21 NOTE — Telephone Encounter (Signed)
Call in from Nurse Call Team at 1800, patient with over 20% skin coverage of poison oak.  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  Has taken Prednisone before for this with benefit.  She reported to call team that the urgent care facilities near her are now closed and she prefers not to go to ER.  At this time discussed with Florentina Addison, call team nurse, will call in Prednisone taper, but request patient attend urgent care tomorrow to assess rash face to face and ensure no other interventions needed.  Will alert her PCP to this. ?

## 2021-07-23 NOTE — Telephone Encounter (Signed)
Noted  

## 2021-10-05 ENCOUNTER — Other Ambulatory Visit: Payer: Self-pay | Admitting: Internal Medicine

## 2021-10-08 MED ORDER — ALPRAZOLAM 0.5 MG PO TABS: 0.5000 mg | ORAL_TABLET | Freq: Every day | ORAL | 0 refills | Status: AC | PRN

## 2022-01-03 ENCOUNTER — Other Ambulatory Visit: Payer: Self-pay | Admitting: Internal Medicine

## 2022-01-03 DIAGNOSIS — Z1231 Encounter for screening mammogram for malignant neoplasm of breast: Secondary | ICD-10-CM

## 2022-02-05 ENCOUNTER — Ambulatory Visit
Admission: RE | Admit: 2022-02-05 | Discharge: 2022-02-05 | Disposition: A | Discharge status: Home / Self Care | Payer: BLUE CROSS/BLUE SHIELD | Source: Ambulatory Visit | Attending: Internal Medicine | Admitting: Internal Medicine

## 2022-02-05 DIAGNOSIS — Z1231 Encounter for screening mammogram for malignant neoplasm of breast: Secondary | ICD-10-CM | POA: Diagnosis present

## 2022-02-07 ENCOUNTER — Other Ambulatory Visit: Payer: Self-pay | Admitting: Internal Medicine

## 2022-02-07 ENCOUNTER — Encounter: Payer: Self-pay | Admitting: Internal Medicine

## 2022-02-07 NOTE — Telephone Encounter (Signed)
Requested Prescriptions  Pending Prescriptions Disp Refills   SUMAtriptan (IMITREX) 25 MG tablet [Pharmacy Med Name: SUMATRIPTAN 25 MG TAB] 10 tablet 2    Sig: TAKE ONE TABLET BY MOUTH AT ONSET OF HEADACHE. MAY REPEAT DOSE WITH ONE TABLET IN TWO HOURS IF NEEDED. DO NOT EXCEED TWO TABLETS IN 24 HOURS.     Neurology:  Migraine Therapy - Triptan Passed - 02/07/2022  5:19 PM      Passed - Last BP in normal range    BP Readings from Last 1 Encounters:  03/19/21 99/60         Passed - Valid encounter within last 12 months    Recent Outpatient Visits           10 months ago Encounter for general adult medical examination with abnormal findings   [facility] [place], Salvadore Oxford, NP   12 months ago Acute nasopharyngitis   [facility] [place], Salvadore Oxford, NP   1 year ago Poison ivy   [facility] [place], Salvadore Oxford, [place]

## 2023-04-07 ENCOUNTER — Ambulatory Visit: Payer: Self-pay

## 2024-01-15 ENCOUNTER — Ambulatory Visit

## 2024-01-16 ENCOUNTER — Ambulatory Visit

## 2024-01-16 DIAGNOSIS — Z23 Encounter for immunization: Secondary | ICD-10-CM

## 2024-01-16 DIAGNOSIS — Z113 Encounter for screening for infections with a predominantly sexual mode of transmission: Secondary | ICD-10-CM

## 2024-01-16 DIAGNOSIS — Z202 Contact with and (suspected) exposure to infections with a predominantly sexual mode of transmission: Secondary | ICD-10-CM

## 2024-01-16 LAB — WET PREP FOR TRICH, YEAST, CLUE
Clue Cell Exam: NEGATIVE
Trichomonas Exam: NEGATIVE
Yeast Exam: NEGATIVE

## 2024-01-16 LAB — HM HIV SCREENING LAB: HM HIV Screening: NEGATIVE

## 2024-01-16 MED ORDER — CEFTRIAXONE SODIUM 500 MG IJ SOLR
500.0000 mg | Freq: Once | INTRAMUSCULAR | Status: AC
  Administered 2024-01-16: 500 mg via INTRAMUSCULAR

## 2024-01-16 MED ORDER — DOXYCYCLINE HYCLATE 100 MG PO TABS: 100.0000 mg | ORAL_TABLET | Freq: Two times a day (BID) | ORAL | Status: AC

## 2024-01-16 NOTE — Progress Notes (Addendum)
 Pt is here for STD screening and a contact to Chlamydia and Gonorrhea. The patient was dispensed doxycyline 100 mg capsuless 2x/day for 7 days. Ceftriaxone  500 mg IM injection given at the RUOQ  and patient tolerated well to injection. I provided counseling today regarding the medication, the side effects and when to call clinic. Patient was given the opportunity to ask questions for any clarifications. Questions answered. Brochure given and condoms declined. Wilkie Drought, RN.

## 2024-01-16 NOTE — Progress Notes (Signed)
 [facility] Department STI clinic [address] [place] [address] Main phone: [phone]  STI screening visit  Subjective:  MAELYN BERREY is a 38 y.o. female being seen today for an STI screening visit. The patient reports they do not have symptoms.  Patient reports that they do not desire a pregnancy in the next year/Patient had a hysterectomy.  No LMP recorded. Patient has had a hysterectomy.  Patient has the following medical conditions:  Patient Active Problem List   Diagnosis Date Noted   Insomnia 11/25/2017   Anxiety 09/24/2016   Frequent headaches 09/24/2016   Chief Complaint  Patient presents with   SEXUALLY TRANSMITTED DISEASE   HPI Patient reports exposure to gonorrhea and chlamydia - last contact was 6 days ago. She has no symptoms. Her partner was treated but they had sexual contact before he finished his medication. He is being seen as well for re-treatment per patient.  Does the patient using douching products? No  See flowsheet for further details and programmatic requirements Hyperlink available at the top of the signed note in blue.  Flow sheet content below:  Pregnancy Intention Screening Does the patient want to become pregnant in the next year?: No Does the patient's partner want to become pregnant in the next year?: No Would the patient like to discuss contraceptive options today?: No Reason For STD Screen STD Screening: Is a contact Have you ever had an STD?: Yes History of Antibiotic use in the past 2 weeks?: No STD Symptoms Denies all: Yes Risk Factors for Hep B Household, sexual, or needle sharing contact of a person infected with Hep B: No Sexual contact with a person who uses drugs not as prescribed?: No Currently or Ever used drugs not as prescribed: No HIV Positive: No PRep Patient: No Men who have sex with men: No Have Hepatitis C: No History of Incarceration: No History of Homeslessness?:  No Anal sex following anal drug use?: No Risk Factors for Hep C Currently using drugs not as prescribed: No Sexual partner(s) currently using drugs as not prescribed: No History of drug use: No HIV Positive: No People with a history of incarceration: No People born between the years of 39 and 12: No Counseling Patient counseled to use condoms with all sex: Condoms declined RTC in 2-3 weeks for test results: Yes Clinic will call if test results abnormal before test result appt.: Yes Test results given to patient Patient counseled to use condoms with all sex: Condoms declined   Screening for MPX risk:  Unexplained rash?  No   MSM?  No   Multiple or anonymous sex partners?  No   Any close or sexual contact with a person  diagnosed with MPX?  No   Any outside the [place]  where MPX is endemic?  No   High clinical suspicion for MPX?    -Unlikely to be chickenpox    -Lymphadenopathy    -Rash that presents in same phase of       evolution on any given body part  No   Screenings: Last HIV test per patient/review of record was No results found for: HMHIVSCREEN  Lab Results  Component Value Date   HIV NONREACTIVE 09/20/2015    Last HEPC test per patient/review of record was No results found for: HMHEPCSCREEN No components found for: HEPC   Last HEPB test per patient/review of record was No components found for: HMHEPBSCREEN   Patient reports last pap was:   No  results found for: SPECADGYN Result Date Procedure Results Follow-ups  11/25/2017 Cytology - PAP([place]) Adequacy: Satisfactory for evaluation. (A) Diagnosis: ATYPICAL SQUAMOUS CELLS OF UNDETERMINED SIGNIFICANCE (ASC-US ). (A) Bacterial vaginitis: Negative for Bacterial Vaginitis Microorganisms Candida vaginitis: Negative for Candida species Chlamydia: Negative Neisseria Gonorrhea: Negative Trichomonas: Negative HPV: NOT DETECTED Material Submitted: Vaginal Pap [ThinPrep Imaged] (A) CYTOLOGY - PAP: PAP RESULT    09/24/2016 Cytology - PAP Adequacy: Satisfactory for evaluation. Diagnosis: NEGATIVE FOR INTRAEPITHELIAL LESIONS OR MALIGNANCY. Bacterial vaginitis: Negative for Bacterial Vaginitis Microorganisms Candida vaginitis: Negative for Candida species Chlamydia: Negative Neisseria Gonorrhea: Negative Trichomonas: Negative HPV: DETECTED (A) HPV 16/18/45 genotyping: NEGATIVE for HPV 16 & 18/45 Material Submitted: CervicoVaginal Pap [ThinPrep Imaged] CYTOLOGY - PAP: PAP RESULT   09/20/2015 Cytology - PAP [place] CYTOLOGY - PAP: PAP RESULT   06/16/2013 Cytology - PAP [place]    05/30/2013 HM PAP SMEAR HM Pap smear: hysterectomy   05/21/2012 Cytology - PAP     Immunization history:  Immunization History  Administered Date(s) Administered   Hepatitis B 01/10/1997, 02/07/1997, 07/18/1997   Influenza,inj,Quad PF,6+ Mos 01/10/2017, 01/07/2020   Influenza,inj,quad, With Preservative 01/31/2020   Influenza-Unspecified 12/31/2013, 05/28/2018, 01/13/2019, 01/12/2021   PFIZER(Purple Top)SARS-COV-2 Vaccination 11/17/2019, 12/08/2019   Tdap 07/01/2005, 12/09/2011, 11/17/2018, 07/31/2020   Unspecified SARS-COV-2 Vaccination 10/31/2019, 12/01/2019, 05/02/2020    The following portions of the patient's history were reviewed and updated as appropriate: allergies, current medications, past medical history, past social history, past surgical history and problem list.  Objective:  There were no vitals filed for this visit.  Physical Exam Vitals and nursing note reviewed.  Constitutional:      Appearance: Normal appearance.  HENT:     Head: Normocephalic.     Mouth/Throat:     Lips: Pink. No lesions.     Mouth: Mucous membranes are moist.  Cardiovascular:     Rate and Rhythm: Normal rate.  Pulmonary:     Effort: Pulmonary effort is normal.  Abdominal:     General: Abdomen is flat.     Palpations: Abdomen is soft.  Genitourinary:    Comments: Declined genital exam- no symptoms, self  swabbed Musculoskeletal:        General: Normal range of motion.  Lymphadenopathy:     Head:     Right side of head: No submandibular, preauricular or posterior auricular adenopathy.     Left side of head: No submandibular, preauricular or posterior auricular adenopathy.     Cervical: No cervical adenopathy.     Upper Body:     Right upper body: No supraclavicular or axillary adenopathy.     Left upper body: No supraclavicular or axillary adenopathy.  Skin:    General: Skin is warm and dry.     Findings: No rash.  Neurological:     Mental Status: She is alert and oriented to person, place, and time.  Psychiatric:        Mood and Affect: Mood normal.        Behavior: Behavior is cooperative.    Assessment and Plan:  ELLINA SIVERTSEN is a 38 y.o. female presenting to the [facility] Department for STI screening  1. Screening for venereal disease (Primary)  - WET PREP FOR TRICH, YEAST, CLUE - Chlamydia/Gonorrhea [place] [facility] - HIV [facility] - Syphilis Serology,  Lab  2. Chlamydia contact  - doxycycline (VIBRA-TABS) 100 MG tablet; Take 1 tablet (100 mg total) by mouth 2 (two) times daily for 7 days.  3. Gonorrhea contact  - cefTRIAXone  (ROCEPHIN ) injection 500 mg   Patient accepted the following screenings: vaginal CT/GC swab, vaginal wet prep, HIV, and RPR Patient meets criteria for HepB screening? No. Ordered? no Patient meets criteria for HepC screening? No. Ordered? no  Treat wet prep per standing order Discussed time line for [facility] results and that patient will be called with positive results and encouraged patient to call if she had not heard in 2 weeks.  Counseled to return or seek care for continued or worsening symptoms Recommended repeat testing in 3 months with positive results. Recommended condom use with all sex for STI prevention.   Return in about 3 months (around 04/17/2024).  No future appointments.  Damien FORBES Satchel, NP

## 2024-02-16 ENCOUNTER — Ambulatory Visit: Admitting: Family Medicine

## 2024-02-16 ENCOUNTER — Encounter: Payer: Self-pay | Admitting: Family Medicine

## 2024-02-16 DIAGNOSIS — Z113 Encounter for screening for infections with a predominantly sexual mode of transmission: Secondary | ICD-10-CM

## 2024-02-16 DIAGNOSIS — Z202 Contact with and (suspected) exposure to infections with a predominantly sexual mode of transmission: Secondary | ICD-10-CM

## 2024-02-16 MED ORDER — CEFTRIAXONE SODIUM 500 MG IJ SOLR
500.0000 mg | Freq: Once | INTRAMUSCULAR | Status: AC
  Administered 2024-02-16: 500 mg via INTRAMUSCULAR

## 2024-02-16 MED ORDER — DOXYCYCLINE HYCLATE 100 MG PO TABS: 100.0000 mg | ORAL_TABLET | Freq: Two times a day (BID) | ORAL | Status: AC

## 2024-02-16 NOTE — Progress Notes (Signed)
 Pt here for STI screening and for treatment as contact to chlamydia and gonorrhea.  Doxycycline 100mg  #14 dispensed to patient.  Ceftriaxone  500mg  IM given in LUOQ without complications.  Counseled re medications, side effects, plan of care and when to contact clinic with questions or concerns.  Verbalizes understanding of above.  Condoms declined.-Lashonne Shull, RN

## 2024-02-16 NOTE — Progress Notes (Signed)
 [facility] Department STI clinic [address] [place] [address] Main phone: [phone]  STI screening visit  Subjective:  Lindsay Collins is a 38 y.o. female being seen today for an STI screening visit. The patient reports they do have symptoms.    Patient reports they are not pregnant . They do not desire a pregnancy in the next year. Patient is currently using female sterilization to prevent pregnancy. They reported they are not interested in discussing contraception today.    No LMP recorded. Patient has had a hysterectomy.  Patient has the following medical conditions:  Patient Active Problem List   Diagnosis Date Noted   Insomnia 11/25/2017   Anxiety 09/24/2016   Frequent headaches 09/24/2016   Chief Complaint  Patient presents with   SEXUALLY TRANSMITTED DISEASE    STI screening-discharge whitish, vaginal itching-states partner was treated for GC/chlamydia approximately 1 month ago and didn't complete treatment before they resumed relationship and wasn't retreated.    HPI Patient reports to clinic after being seen at [facility] this morning. She was diagnosed with a UTI and treated for yeast. BV and Trich negative. No other STI testing done.   Does the patient using douching products? Practitioner oversight- forgot to ask  See flowsheet for further details and programmatic requirements Hyperlink available at the top of the signed note in blue.  Flow sheet content below:      Screening for MPX risk:  Unexplained rash?  No   MSM?  No   Multiple or anonymous sex partners?  No   Any close or sexual contact with a person  diagnosed with MPX?  No   Any outside the [place]  where MPX is endemic?  No   High clinical suspicion for MPX?    -Unlikely to be chickenpox    -Lymphadenopathy    -Rash that presents in same phase of       evolution on any given body part  No   Screenings: Last HIV test per patient/review of record was  Lab Results   Component Value Date   HMHIVSCREEN Negative - Validated 01/16/2024    Lab Results  Component Value Date   HIV NONREACTIVE 09/20/2015     Last HEPC test per patient/review of record was No results found for: HMHEPCSCREEN No components found for: HEPC   Last HEPB test per patient/review of record was No components found for: HMHEPBSCREEN   Patient reports last pap was:   No results found for: SPECADGYN Result Date Procedure Results Follow-ups  11/25/2017 Cytology - PAP([place]) Adequacy: Satisfactory for evaluation. (A) Diagnosis: ATYPICAL SQUAMOUS CELLS OF UNDETERMINED SIGNIFICANCE (ASC-US ). (A) Bacterial vaginitis: Negative for Bacterial Vaginitis Microorganisms Candida vaginitis: Negative for Candida species Chlamydia: Negative Neisseria Gonorrhea: Negative Trichomonas: Negative HPV: NOT DETECTED Material Submitted: Vaginal Pap [ThinPrep Imaged] (A) CYTOLOGY - PAP: PAP RESULT   09/24/2016 Cytology - PAP Adequacy: Satisfactory for evaluation. Diagnosis: NEGATIVE FOR INTRAEPITHELIAL LESIONS OR MALIGNANCY. Bacterial vaginitis: Negative for Bacterial Vaginitis Microorganisms Candida vaginitis: Negative for Candida species Chlamydia: Negative Neisseria Gonorrhea: Negative Trichomonas: Negative HPV: DETECTED (A) HPV 16/18/45 genotyping: NEGATIVE for HPV 16 & 18/45 Material Submitted: CervicoVaginal Pap [ThinPrep Imaged] CYTOLOGY - PAP: PAP RESULT   09/20/2015 Cytology - PAP [place] CYTOLOGY - PAP: PAP RESULT   06/16/2013 Cytology - PAP [place]    05/30/2013 HM PAP SMEAR HM Pap smear: hysterectomy   05/21/2012 Cytology - PAP      Immunization history:  Immunization History  Administered  Date(s) Administered   Hepatitis B 01/10/1997, 02/07/1997, 07/18/1997   Influenza,inj,Quad PF,6+ Mos 01/10/2017, 01/07/2020   Influenza,inj,quad, With Preservative 01/31/2020   Influenza-Unspecified 12/31/2013, 05/28/2018, 01/13/2019, 01/12/2021   PFIZER(Purple Top)SARS-COV-2  Vaccination 11/17/2019, 12/08/2019   Tdap 07/01/2005, 12/09/2011, 11/17/2018, 07/31/2020   Unspecified SARS-COV-2 Vaccination 10/31/2019, 12/01/2019, 05/02/2020    The following portions of the patient's history were reviewed and updated as appropriate: allergies, current medications, past medical history, past social history, past surgical history and problem list.  Objective:  There were no vitals filed for this visit.  Physical Exam Vitals and nursing note reviewed.  Constitutional:      Appearance: Normal appearance.  HENT:     Head: Normocephalic.     Mouth/Throat:     Mouth: Mucous membranes are moist.  Cardiovascular:     Rate and Rhythm: Normal rate.  Pulmonary:     Effort: Pulmonary effort is normal.  Abdominal:     Palpations: Abdomen is soft.  Genitourinary:    Comments: Declined genital exam-self swabbed Musculoskeletal:        General: Normal range of motion.  Lymphadenopathy:     Head:     Right side of head: No submandibular, preauricular or posterior auricular adenopathy.     Left side of head: No submandibular, preauricular or posterior auricular adenopathy.     Cervical: No cervical adenopathy.     Upper Body:     Right upper body: No supraclavicular or axillary adenopathy.     Left upper body: No supraclavicular or axillary adenopathy.  Skin:    General: Skin is warm and dry.  Neurological:     Mental Status: She is alert and oriented to person, place, and time.  Psychiatric:        Mood and Affect: Mood normal.      Assessment and Plan:  [name] is a 38 y.o. female presenting to the [facility] Department for STI screening  1. Screening for venereal disease (Primary) -wet prep done this morning -declined blood work  - Chlamydia/Gonorrhea [facility]  2. Exposure to chlamydia -retreated today -partner getting re-treated today at another facility  - Chlamydia/Gonorrhea [place] [facility] - doxycycline (VIBRA-TABS) 100 MG  tablet; Take 1 tablet (100 mg total) by mouth 2 (two) times daily for 7 days.  3. Exposure to gonorrhea -retreated today -partner getting re-treated today at another facility  - Chlamydia/Gonorrhea  Lab - cefTRIAXone  (ROCEPHIN ) injection 500 mg    Patient accepted the following screenings: vaginal CT/GC swab Patient meets criteria for HepB screening? No. Ordered? not applicable Patient meets criteria for HepC screening? No. Ordered? not applicable  Treat wet prep per standing order Discussed time line for [facility] results and that patient will be called with positive results and encouraged patient to call if she had not heard in 2 weeks.  Counseled to return or seek care for continued or worsening symptoms Recommended repeat testing in 3 months with positive results. Recommended condom use with all sex for STI prevention.   Return if symptoms worsen or fail to improve.  No future appointments.  Verneta Bers, [place]

## 2024-02-23 ENCOUNTER — Telehealth: Payer: Self-pay | Admitting: Family Medicine

## 2024-03-10 ENCOUNTER — Ambulatory Visit
Admission: EM | Admit: 2024-03-10 | Discharge: 2024-03-10 | Disposition: A | Discharge status: Home / Self Care | Attending: Emergency Medicine | Admitting: Emergency Medicine

## 2024-03-10 ENCOUNTER — Encounter: Payer: Self-pay | Admitting: Emergency Medicine

## 2024-03-10 DIAGNOSIS — N898 Other specified noninflammatory disorders of vagina: Secondary | ICD-10-CM | POA: Diagnosis present

## 2024-03-10 DIAGNOSIS — R300 Dysuria: Secondary | ICD-10-CM | POA: Diagnosis present

## 2024-03-10 DIAGNOSIS — N390 Urinary tract infection, site not specified: Secondary | ICD-10-CM

## 2024-03-10 LAB — POCT URINE DIPSTICK
Bilirubin, UA: NEGATIVE
Blood, UA: NEGATIVE
Glucose, UA: NEGATIVE mg/dL
Ketones, POC UA: NEGATIVE mg/dL
Nitrite, UA: NEGATIVE
Protein Ur, POC: NEGATIVE mg/dL
Spec Grav, UA: >=1.03 — AB (ref 1.010–1.025)
Urobilinogen, UA: 0.2 U/dL
pH, UA: 5.5 (ref 5.0–8.0)

## 2024-03-10 MED ORDER — NITROFURANTOIN MACROCRYSTAL 100 MG PO CAPS: 100.0000 mg | ORAL_CAPSULE | Freq: Two times a day (BID) | ORAL | 0 refills | Status: AC

## 2024-03-10 NOTE — ED Provider Notes (Signed)
 [facility]    CSN: [id number] Arrival date & time: 03/10/24  1749      History   Chief Complaint Chief Complaint  Patient presents with   Abdominal Pain   Vaginal Discharge    HPI Lindsay Collins is a 38 y.o. female.   38 year old female, Lindsay Collins, presents to urgent care for evalaution of uti symptoms, greenish/grey vaginal discharge that started today after pt went to bathroom and urinated.  Pt had virtual visit and was scripted Diflucan  for possible yeast infection as pt was on abx last month for UTI.  Patient was advised to come to the urgent care for sti swab.  Patient states she has only had 1 boyfriend however they broke up a couple of months ago and recently got back together, unknown if STI exposure.  Pt has a hx of UTI, had hysterectomy  The history is provided by the patient. No language interpreter was used.    Past Medical History:  Diagnosis Date   History of shingles 02/22/2019   Nephrolithiasis     Patient Active Problem List   Diagnosis Date Noted   Vaginal discharge 03/10/2024   Dysuria 03/10/2024   Acute UTI 03/10/2024   Insomnia 11/25/2017   Anxiety 09/24/2016   Frequent headaches 09/24/2016    Past Surgical History:  Procedure Laterality Date   ABDOMINAL HYSTERECTOMY  2015   abnormal PAPs   OOPHORECTOMY      OB History   No obstetric history on file.      Home Medications    Prior to Admission medications   Medication Sig Start Date End Date Taking? Authorizing Provider  nitrofurantoin (MACRODANTIN) 100 MG capsule Take 1 capsule (100 mg total) by mouth 2 (two) times daily for 5 days. 03/10/24 03/15/24 Yes Owain Eckerman, [place], NP  ALPRAZolam  (XANAX ) 0.5 MG tablet Take 1 tablet (0.5 mg total) by mouth daily as needed for anxiety. Patient not taking: Reported on 02/16/2024 10/08/21   Antonette Angeline ORN, NP  permethrin  (ELIMITE ) 5 % cream Apply cream whole body, from head to toe at bedtime; leave on for 8 to 12 hours  (overnight), wash off next day. Repeat in 14 days if needed. Patient not taking: Reported on 02/16/2024 03/30/21   Antonette Angeline ORN, NP  predniSONE  (DELTASONE ) 10 MG tablet Take 6 tablets by mouth daily for 2 days, then reduce by 1 tablet every 2 days until gone 07/21/21   Cannady, Jolene T, NP  promethazine -dextromethorphan (PROMETHAZINE -DM) 6.25-15 MG/5ML syrup Take 5 mLs by mouth 4 (four) times daily as needed for cough. Patient not taking: Reported on 02/16/2024 04/25/21   Antonette Angeline ORN, NP  SUMAtriptan  (IMITREX ) 25 MG tablet TAKE 1 TAB BY MOUTH EVERY 2HRS AS NEEDED FOR MIRGRAINE MAY REPEAT IN 2HRS IF HEADACHE PERSISTS Patient not taking: Reported on 02/16/2024 03/19/21   Antonette Angeline ORN, NP    Family History Family History  Problem Relation Age of Onset   COPD Mother    Breast cancer Mother 88   COPD Father    Cancer Father        prostate    Social History Social History   Tobacco Use   Smoking status: Never   Smokeless tobacco: Never  Vaping Use   Vaping status: Never Used  Substance Use Topics   Alcohol use: Yes    Comment: social   Drug use: No     Allergies   Patient has no known allergies.   Review of Systems  Review of Systems  Constitutional:  Negative for fever.  Gastrointestinal:  Positive for abdominal pain and nausea.  Genitourinary:  Positive for dysuria and vaginal discharge.  All other systems reviewed and are negative.    Physical Exam Triage Vital Signs ED Triage Vitals  Encounter Vitals Group     BP      Girls Systolic BP Percentile      Girls Diastolic BP Percentile      Boys Systolic BP Percentile      Boys Diastolic BP Percentile      Pulse      Resp      Temp      Temp src      SpO2      Weight      Height      Head Circumference      Peak Flow      Pain Score      Pain Loc      Pain Education      Exclude from Growth Chart    No data found.  Updated Vital Signs BP 115/85 (BP Location: Left Arm)   Pulse 60   Temp 98  F (36.7 C) (Oral)   Resp 16   Wt 133 lb (60.3 kg)   SpO2 100%   BMI 22.83 kg/m   Visual Acuity Right Eye Distance:   Left Eye Distance:   Bilateral Distance:    Right Eye Near:   Left Eye Near:    Bilateral Near:     Physical Exam Vitals and nursing note reviewed.  Constitutional:      General: She is not in acute distress.    Appearance: She is well-developed.  HENT:     Head: Normocephalic and atraumatic.  Eyes:     Conjunctiva/sclera: Conjunctivae normal.  Cardiovascular:     Rate and Rhythm: Normal rate and regular rhythm.     Heart sounds: Normal heart sounds. No murmur heard. Pulmonary:     Effort: Pulmonary effort is normal. No respiratory distress.     Breath sounds: Normal breath sounds.  Abdominal:     Palpations: Abdomen is soft.     Tenderness: There is abdominal tenderness in the suprapubic area. There is no guarding or rebound.  Genitourinary:    Comments: Pt self swabbed Musculoskeletal:        General: No swelling.     Cervical back: Neck supple.  Skin:    General: Skin is warm and dry.     Capillary Refill: Capillary refill takes less than 2 seconds.  Neurological:     Mental Status: She is alert.  Psychiatric:        Mood and Affect: Mood normal.      UC Treatments / Results  Labs (all labs ordered are listed, but only abnormal results are displayed) Labs Reviewed  POCT URINE DIPSTICK - Abnormal; Notable for the following components:      Result Value   Clarity, UA cloudy (*)    Spec Grav, UA >=1.030 (*)    Leukocytes, UA Small (1+) (*)    All other components within normal limits  CERVICOVAGINAL ANCILLARY ONLY    EKG   Radiology No results found.  Procedures Procedures (including critical care time)  Medications Ordered in UC Medications - No data to display  Initial Impression / Assessment and Plan / UC Course  I have reviewed the triage vital signs and the nursing notes.  Pertinent labs & imaging results that were  available during my care of the patient were reviewed by me and considered in my medical decision making (see chart for details).    Discussed exam findings and plan of care with patient, safe sex, pt will check my chart for results, macrodantin scripted for UTI, urine cx pending, sti swab pending, strict go to ER precautions given.   Patient verbalized understanding to this provider.  Ddx: Vaginal discharge, UTI, dysuria, STI Final Clinical Impressions(s) / UC Diagnoses   Final diagnoses:  Vaginal discharge  Dysuria  Acute UTI     Discharge Instructions      Take antibiotic for UTI, take diflucan  as prescribed by PCP earlier today.  Check my chart for results of STI swab. Avoid sexual activity until results,treatment known and completed. Safe sex with all future sexual activity. We have sent testing for sexually transmitted infections. We will notify you of any positive results once they are received. If required, we will prescribe any medications you might need for STI, we will not notify you of negative results.        ED Prescriptions     Medication Sig Dispense Auth. Provider   nitrofurantoin (MACRODANTIN) 100 MG capsule Take 1 capsule (100 mg total) by mouth 2 (two) times daily for 5 days. 10 capsule Keora Eccleston, NP      PDMP not reviewed this encounter.   Aminta Loose, NP 03/10/24 2038

## 2024-03-10 NOTE — Discharge Instructions (Addendum)
 Take antibiotic for UTI, take diflucan  as prescribed by PCP earlier today.  Check my chart for results of STI swab. Avoid sexual activity until results,treatment known and completed. Safe sex with all future sexual activity. We have sent testing for sexually transmitted infections. We will notify you of any positive results once they are received. If required, we will prescribe any medications you might need for STI, we will not notify you of negative results.

## 2024-03-10 NOTE — ED Triage Notes (Signed)
 Pt presents with some nausea, abdominal pain and vaginal discharge that started today.

## 2024-03-11 ENCOUNTER — Ambulatory Visit: Payer: Self-pay | Admitting: Emergency Medicine

## 2024-03-11 LAB — CERVICOVAGINAL ANCILLARY ONLY
Bacterial Vaginitis (gardnerella): POSITIVE — AB
Candida Glabrata: NEGATIVE
Candida Vaginitis: POSITIVE — AB
Chlamydia: NEGATIVE
Comment: NEGATIVE
Comment: NEGATIVE
Comment: NEGATIVE
Comment: NEGATIVE
Comment: NEGATIVE
Comment: NORMAL
Neisseria Gonorrhea: NEGATIVE
Trichomonas: NEGATIVE

## 2024-03-11 MED ORDER — METRONIDAZOLE 500 MG PO TABS: 500.0000 mg | ORAL_TABLET | Freq: Two times a day (BID) | ORAL | 0 refills | Status: AC
